# Patient Record
Sex: Male | Born: 1935 | Race: White | Hispanic: No | Marital: Married | Smoking: Former smoker
Health system: Southern US, Community
[De-identification: ages and names within clinical notes are randomized; demographics above are authoritative.]

## PROBLEM LIST (undated history)

## (undated) DIAGNOSIS — E7800 Pure hypercholesterolemia, unspecified: Secondary | ICD-10-CM

## (undated) DIAGNOSIS — I442 Atrioventricular block, complete: Secondary | ICD-10-CM

## (undated) DIAGNOSIS — K5792 Diverticulitis of intestine, part unspecified, without perforation or abscess without bleeding: Secondary | ICD-10-CM

## (undated) DIAGNOSIS — C493 Malignant neoplasm of connective and soft tissue of thorax: Secondary | ICD-10-CM

## (undated) DIAGNOSIS — I10 Essential (primary) hypertension: Secondary | ICD-10-CM

## (undated) HISTORY — DX: Essential (primary) hypertension: I10

## (undated) HISTORY — DX: Diverticulitis of intestine, part unspecified, without perforation or abscess without bleeding: K57.92

## (undated) HISTORY — DX: Atrioventricular block, complete: I44.2

## (undated) HISTORY — DX: Malignant neoplasm of connective and soft tissue of thorax: C49.3

## (undated) HISTORY — DX: Pure hypercholesterolemia, unspecified: E78.00

## (undated) HISTORY — PX: TUMOR EXCISION: SHX421

---

## 2004-08-26 ENCOUNTER — Encounter: Admission: RE | Admit: 2004-08-26 | Discharge: 2004-08-26 | Payer: Self-pay | Admitting: Family Medicine

## 2008-07-10 ENCOUNTER — Encounter: Admission: RE | Admit: 2008-07-10 | Discharge: 2008-07-10 | Payer: Self-pay | Admitting: Family Medicine

## 2015-09-23 DIAGNOSIS — I442 Atrioventricular block, complete: Secondary | ICD-10-CM

## 2015-09-23 HISTORY — DX: Atrioventricular block, complete: I44.2

## 2015-09-27 ENCOUNTER — Telehealth: Payer: Self-pay | Admitting: Nurse Practitioner

## 2015-09-27 NOTE — Telephone Encounter (Signed)
[facility] records received placed in chart prep bin for 10/01/15 appointment with L. Servando Snare.

## 2015-09-28 ENCOUNTER — Encounter: Payer: Self-pay | Admitting: Nurse Practitioner

## 2015-10-01 ENCOUNTER — Ambulatory Visit: Payer: Self-pay | Admitting: Nurse Practitioner

## 2015-10-08 ENCOUNTER — Encounter: Payer: Self-pay | Admitting: *Deleted

## 2015-10-08 ENCOUNTER — Telehealth: Payer: Self-pay | Admitting: Internal Medicine

## 2015-10-08 ENCOUNTER — Ambulatory Visit (INDEPENDENT_AMBULATORY_CARE_PROVIDER_SITE_OTHER): Payer: Medicare Other | Admitting: Nurse Practitioner

## 2015-10-08 ENCOUNTER — Encounter: Payer: Self-pay | Admitting: Nurse Practitioner

## 2015-10-08 VITALS — BP 140/82 | HR 52 | Ht 69.0 in | Wt 199.0 lb

## 2015-10-08 DIAGNOSIS — I442 Atrioventricular block, complete: Secondary | ICD-10-CM

## 2015-10-08 NOTE — Telephone Encounter (Signed)
I left a message for the patient to call. Need to try to move his procedure time with Dr. Caryl Comes from 11:30 am start to 9:30 am start time.

## 2015-10-08 NOTE — Patient Instructions (Addendum)
We will be checking the following labs today - NONE  Come for Lab on January 27th - BMET, CBC, PT, PTT   Medication Instructions:    Continue with your current medicines.     Testing/Procedures To Be Arranged:  Echocardiogram on Wednesday at 7:30 AM  Follow-Up:   Pacemaker scheduled for Friday, February 3rd    Other Special Instructions:   N/A    If you need a refill on your cardiac medications before your next appointment, please call your pharmacy.   Call the [place] office at [phone] if you have any questions, problems or concerns.
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
## 2015-10-09 NOTE — Telephone Encounter (Signed)
I spoke with the patient's wife. She is aware to have the patient arrive at 7:30 am on 10/26/15 for a 9:30 am case.

## 2015-10-09 NOTE — Telephone Encounter (Signed)
F/u ° ° °Pt returning your call °

## 2015-10-10 ENCOUNTER — Other Ambulatory Visit: Payer: Self-pay

## 2015-10-10 ENCOUNTER — Ambulatory Visit (HOSPITAL_COMMUNITY): Payer: Medicare Other | Attending: Cardiovascular Disease

## 2015-10-10 DIAGNOSIS — I517 Cardiomegaly: Secondary | ICD-10-CM | POA: Diagnosis not present

## 2015-10-10 DIAGNOSIS — I350 Nonrheumatic aortic (valve) stenosis: Secondary | ICD-10-CM | POA: Diagnosis not present

## 2015-10-10 DIAGNOSIS — I071 Rheumatic tricuspid insufficiency: Secondary | ICD-10-CM | POA: Insufficient documentation

## 2015-10-10 DIAGNOSIS — I442 Atrioventricular block, complete: Secondary | ICD-10-CM | POA: Insufficient documentation

## 2015-10-10 DIAGNOSIS — R011 Cardiac murmur, unspecified: Secondary | ICD-10-CM | POA: Diagnosis present

## 2015-10-10 DIAGNOSIS — R001 Bradycardia, unspecified: Secondary | ICD-10-CM | POA: Insufficient documentation

## 2015-10-12 ENCOUNTER — Other Ambulatory Visit: Payer: Self-pay | Admitting: Internal Medicine

## 2015-10-12 DIAGNOSIS — R001 Bradycardia, unspecified: Secondary | ICD-10-CM

## 2015-10-15 ENCOUNTER — Ambulatory Visit: Payer: Medicare Other | Admitting: Physician Assistant

## 2015-10-19 ENCOUNTER — Other Ambulatory Visit (INDEPENDENT_AMBULATORY_CARE_PROVIDER_SITE_OTHER): Payer: Medicare Other | Admitting: *Deleted

## 2015-10-19 DIAGNOSIS — I442 Atrioventricular block, complete: Secondary | ICD-10-CM | POA: Diagnosis not present

## 2015-10-19 LAB — BASIC METABOLIC PANEL
BUN: 19 mg/dL (ref 7–25)
CO2: 26 mmol/L (ref 20–31)
Calcium: 9.1 mg/dL (ref 8.6–10.3)
Chloride: 107 mmol/L (ref 98–110)
Creat: 1.27 mg/dL — ABNORMAL HIGH (ref 0.70–1.18)
Glucose, Bld: 91 mg/dL (ref 65–99)
Potassium: 3.9 mmol/L (ref 3.5–5.3)
Sodium: 142 mmol/L (ref 135–146)

## 2015-10-19 LAB — CBC
HCT: 49.6 % (ref 39.0–52.0)
Hemoglobin: 16.3 g/dL (ref 13.0–17.0)
MCH: 31.1 pg (ref 26.0–34.0)
MCHC: 32.9 g/dL (ref 30.0–36.0)
MCV: 94.7 fL (ref 78.0–100.0)
MPV: 10.7 fL (ref 8.6–12.4)
Platelets: 180 10*3/uL (ref 150–400)
RBC: 5.24 MIL/uL (ref 4.22–5.81)
RDW: 14 % (ref 11.5–15.5)
WBC: 5.6 10*3/uL (ref 4.0–10.5)

## 2015-10-19 NOTE — Addendum Note (Signed)
Addended by: Eulis Foster on: 10/19/2015 07:52 AM   Modules accepted: Orders

## 2015-10-20 LAB — APTT: aPTT: 33 seconds (ref 24–37)

## 2015-10-20 LAB — PROTIME-INR
INR: 1.02 (ref <1.50)
Prothrombin Time: 13.5 seconds (ref 11.6–15.2)

## 2015-10-26 ENCOUNTER — Encounter (HOSPITAL_COMMUNITY): Payer: Self-pay | Admitting: General Practice

## 2015-10-26 ENCOUNTER — Encounter (HOSPITAL_COMMUNITY)
Admission: RE | Disposition: A | Discharge status: Home / Self Care | Payer: Medicare Other | Source: Ambulatory Visit | Attending: Internal Medicine

## 2015-10-26 ENCOUNTER — Ambulatory Visit (HOSPITAL_COMMUNITY)
Admission: RE | Admit: 2015-10-26 | Discharge: 2015-10-27 | Disposition: A | Discharge status: Home / Self Care | Payer: Medicare Other | Source: Ambulatory Visit | Attending: Internal Medicine | Admitting: Internal Medicine

## 2015-10-26 DIAGNOSIS — Z87891 Personal history of nicotine dependence: Secondary | ICD-10-CM | POA: Diagnosis not present

## 2015-10-26 DIAGNOSIS — I10 Essential (primary) hypertension: Secondary | ICD-10-CM | POA: Diagnosis not present

## 2015-10-26 DIAGNOSIS — Z8249 Family history of ischemic heart disease and other diseases of the circulatory system: Secondary | ICD-10-CM | POA: Diagnosis not present

## 2015-10-26 DIAGNOSIS — F411 Generalized anxiety disorder: Secondary | ICD-10-CM | POA: Diagnosis not present

## 2015-10-26 DIAGNOSIS — M1990 Unspecified osteoarthritis, unspecified site: Secondary | ICD-10-CM | POA: Insufficient documentation

## 2015-10-26 DIAGNOSIS — K5790 Diverticulosis of intestine, part unspecified, without perforation or abscess without bleeding: Secondary | ICD-10-CM | POA: Diagnosis not present

## 2015-10-26 DIAGNOSIS — J449 Chronic obstructive pulmonary disease, unspecified: Secondary | ICD-10-CM | POA: Diagnosis not present

## 2015-10-26 DIAGNOSIS — I442 Atrioventricular block, complete: Secondary | ICD-10-CM | POA: Diagnosis present

## 2015-10-26 DIAGNOSIS — Z959 Presence of cardiac and vascular implant and graft, unspecified: Secondary | ICD-10-CM

## 2015-10-26 DIAGNOSIS — Z7982 Long term (current) use of aspirin: Secondary | ICD-10-CM | POA: Insufficient documentation

## 2015-10-26 DIAGNOSIS — E7800 Pure hypercholesterolemia, unspecified: Secondary | ICD-10-CM | POA: Insufficient documentation

## 2015-10-26 DIAGNOSIS — I350 Nonrheumatic aortic (valve) stenosis: Secondary | ICD-10-CM | POA: Insufficient documentation

## 2015-10-26 DIAGNOSIS — E785 Hyperlipidemia, unspecified: Secondary | ICD-10-CM | POA: Diagnosis not present

## 2015-10-26 DIAGNOSIS — Z85828 Personal history of other malignant neoplasm of skin: Secondary | ICD-10-CM | POA: Diagnosis not present

## 2015-10-26 DIAGNOSIS — R001 Bradycardia, unspecified: Secondary | ICD-10-CM

## 2015-10-26 HISTORY — PX: EP IMPLANTABLE DEVICE: SHX172B

## 2015-10-26 LAB — SURGICAL PCR SCREEN
MRSA, PCR: NEGATIVE
Staphylococcus aureus: NEGATIVE

## 2015-10-26 SURGERY — PACEMAKER IMPLANT

## 2015-10-26 MED ORDER — MUPIROCIN 2 % EX OINT
1.0000 "application " | TOPICAL_OINTMENT | Freq: Once | CUTANEOUS | Status: AC
  Administered 2015-10-26: 1 via TOPICAL

## 2015-10-26 MED ORDER — CEFAZOLIN SODIUM-DEXTROSE 2-3 GM-% IV SOLR
2.0000 g | INTRAVENOUS | Status: AC
  Administered 2015-10-26: 2 g via INTRAVENOUS

## 2015-10-26 MED ORDER — MIDAZOLAM HCL 5 MG/5ML IJ SOLN
INTRAMUSCULAR | Status: AC
  Filled 2015-10-26: qty 5

## 2015-10-26 MED ORDER — MUPIROCIN 2 % EX OINT
TOPICAL_OINTMENT | CUTANEOUS | Status: AC
  Administered 2015-10-26: 1 via TOPICAL
  Filled 2015-10-26: qty 22

## 2015-10-26 MED ORDER — CEFAZOLIN SODIUM-DEXTROSE 2-3 GM-% IV SOLR
INTRAVENOUS | Status: AC
  Filled 2015-10-26: qty 50

## 2015-10-26 MED ORDER — ALBUTEROL SULFATE (2.5 MG/3ML) 0.083% IN NEBU: 2.5000 mg | INHALATION_SOLUTION | RESPIRATORY_TRACT | Status: DC | PRN

## 2015-10-26 MED ORDER — SODIUM CHLORIDE 0.9 % IR SOLN
Status: AC
Start: 2015-10-26 — End: 2015-10-26
  Filled 2015-10-26: qty 2

## 2015-10-26 MED ORDER — MACA ROOT 500 MG PO CAPS: 500.0000 mg | ORAL_CAPSULE | Freq: Every day | ORAL | Status: DC

## 2015-10-26 MED ORDER — SODIUM CHLORIDE 0.9 % IV SOLN
INTRAVENOUS | Status: DC
  Administered 2015-10-26: 09:00:00 via INTRAVENOUS

## 2015-10-26 MED ORDER — ACETAMINOPHEN 325 MG PO TABS: 325.0000 mg | ORAL_TABLET | ORAL | Status: DC | PRN

## 2015-10-26 MED ORDER — HAWTHORNE BERRY 550 MG PO CAPS: 550.0000 mg | ORAL_CAPSULE | Freq: Every day | ORAL | Status: DC

## 2015-10-26 MED ORDER — LABETALOL HCL 200 MG PO TABS
200.0000 mg | ORAL_TABLET | Freq: Two times a day (BID) | ORAL | Status: DC
  Administered 2015-10-26 – 2015-10-27 (×3): 200 mg via ORAL
  Filled 2015-10-26 (×3): qty 1

## 2015-10-26 MED ORDER — FENTANYL CITRATE (PF) 100 MCG/2ML IJ SOLN
INTRAMUSCULAR | Status: AC
  Filled 2015-10-26: qty 2

## 2015-10-26 MED ORDER — LIDOCAINE HCL (PF) 1 % IJ SOLN
INTRAMUSCULAR | Status: AC
  Filled 2015-10-26: qty 30

## 2015-10-26 MED ORDER — MIDAZOLAM HCL 5 MG/5ML IJ SOLN
INTRAMUSCULAR | Status: DC | PRN
  Administered 2015-10-26 (×5): 1 mg via INTRAVENOUS

## 2015-10-26 MED ORDER — CHLORHEXIDINE GLUCONATE 4 % EX LIQD: 60.0000 mL | Freq: Once | CUTANEOUS | Status: DC

## 2015-10-26 MED ORDER — FENTANYL CITRATE (PF) 100 MCG/2ML IJ SOLN
INTRAMUSCULAR | Status: DC | PRN
  Administered 2015-10-26 (×2): 25 ug via INTRAVENOUS

## 2015-10-26 MED ORDER — CEFAZOLIN SODIUM 1-5 GM-% IV SOLN
1.0000 g | Freq: Four times a day (QID) | INTRAVENOUS | Status: AC
  Administered 2015-10-26 – 2015-10-27 (×3): 1 g via INTRAVENOUS
  Filled 2015-10-26 (×3): qty 50

## 2015-10-26 MED ORDER — SODIUM CHLORIDE 0.9 % IR SOLN
80.0000 mg | Status: AC
  Administered 2015-10-26: 80 mg

## 2015-10-26 MED ORDER — SODIUM CHLORIDE 0.9 % IV SOLN
INTRAVENOUS | Status: AC
  Administered 2015-10-26: 13:00:00 via INTRAVENOUS

## 2015-10-26 MED ORDER — HEPARIN (PORCINE) IN NACL 2-0.9 UNIT/ML-% IJ SOLN
INTRAMUSCULAR | Status: AC
  Filled 2015-10-26: qty 500

## 2015-10-26 MED ORDER — VITAMIN D 1000 UNITS PO TABS
2000.0000 [IU] | ORAL_TABLET | Freq: Every day | ORAL | Status: DC
  Administered 2015-10-26 – 2015-10-27 (×2): 2000 [IU] via ORAL
  Filled 2015-10-26 (×3): qty 2

## 2015-10-26 MED ORDER — DIAZEPAM 5 MG PO TABS
5.0000 mg | ORAL_TABLET | Freq: Four times a day (QID) | ORAL | Status: DC | PRN
  Administered 2015-10-26: 5 mg via ORAL
  Filled 2015-10-26: qty 1

## 2015-10-26 MED ORDER — COQ10 100 MG PO CAPS: 100.0000 mg | ORAL_CAPSULE | Freq: Every day | ORAL | Status: DC

## 2015-10-26 MED ORDER — SIMVASTATIN 20 MG PO TABS
20.0000 mg | ORAL_TABLET | Freq: Every day | ORAL | Status: DC
  Administered 2015-10-26 – 2015-10-27 (×2): 20 mg via ORAL
  Filled 2015-10-26 (×2): qty 1

## 2015-10-26 MED ORDER — LIDOCAINE HCL (PF) 1 % IJ SOLN
INTRAMUSCULAR | Status: DC | PRN
  Administered 2015-10-26: 50 mL

## 2015-10-26 MED ORDER — HEPARIN (PORCINE) IN NACL 2-0.9 UNIT/ML-% IJ SOLN
INTRAMUSCULAR | Status: DC | PRN
  Administered 2015-10-26: 11:00:00

## 2015-10-26 MED ORDER — ONDANSETRON HCL 4 MG/2ML IJ SOLN: 4.0000 mg | Freq: Four times a day (QID) | INTRAMUSCULAR | Status: DC | PRN

## 2015-10-26 SURGICAL SUPPLY — 8 items
CABLE SURGICAL S-101-97-12 (CABLE) ×2 IMPLANT
HEMOSTAT SURGICEL 2X4 FIBR (HEMOSTASIS) IMPLANT
LEAD CAPSURE NOVUS 5076-52CM (Lead) ×2 IMPLANT
LEAD CAPSURE NOVUS 5076-58CM (Lead) ×2 IMPLANT
PAD DEFIB LIFELINK (PAD) ×2 IMPLANT
PPM ADVISA MRI DR A2DR01 (Pacemaker) ×2 IMPLANT
SHEATH CLASSIC 7F (SHEATH) ×4 IMPLANT
TRAY PACEMAKER INSERTION (PACKS) ×2 IMPLANT

## 2015-10-26 NOTE — Interval H&P Note (Signed)
History and Physical Interval Note:  10/26/2015 9:08 AM  Sean Morrison  has presented today for surgery, with the diagnosis of hb  The various methods of treatment have been discussed with the patient and family. After consideration of risks, benefits and other options for treatment, the patient has consented to  Procedure(s): Pacemaker Implant (N/A) as a surgical intervention .  The patient's history has been reviewed, patient examined, no change in status, stable for surgery.  I have reviewed the patient's chart and labs.  Questions were answered to the patient's satisfaction.     Virl Axe  Echo normal EF>>dual chamber pacing

## 2015-10-26 NOTE — Progress Notes (Signed)
Left chest wall pacemaker insertion site dressing clean, dry, and intact. Sling applied to left arm. Report given to [facility], 2W 17. Patient alert, some mild confusion present. Vital signs stable.
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
## 2015-10-27 ENCOUNTER — Ambulatory Visit (HOSPITAL_COMMUNITY): Payer: Medicare Other

## 2015-10-27 DIAGNOSIS — I10 Essential (primary) hypertension: Secondary | ICD-10-CM | POA: Diagnosis not present

## 2015-10-27 DIAGNOSIS — E785 Hyperlipidemia, unspecified: Secondary | ICD-10-CM | POA: Diagnosis present

## 2015-10-27 DIAGNOSIS — I442 Atrioventricular block, complete: Secondary | ICD-10-CM

## 2015-10-27 DIAGNOSIS — K5790 Diverticulosis of intestine, part unspecified, without perforation or abscess without bleeding: Secondary | ICD-10-CM | POA: Diagnosis not present

## 2015-10-27 HISTORY — DX: Essential (primary) hypertension: I10

## 2015-10-27 MED ORDER — LABETALOL HCL 200 MG PO TABS: 200.0000 mg | ORAL_TABLET | Freq: Two times a day (BID) | ORAL | Status: DC

## 2015-10-27 NOTE — Progress Notes (Signed)
Return call from PA, stated ok to leave off cardiac monitoring. Someone will be around to d/c patient later this AM. Informed patient and his wife.
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
## 2015-10-27 NOTE — Progress Notes (Signed)
SUBJECTIVE: The patient is doing well today.  At this time, he denies chest pain, shortness of breath, or any new concerns.  Presented to the hospital for pacemaker placement after being found to have heart block.    . cholecalciferol  2,000 Units Oral Daily  . labetalol  200 mg Oral BID  . simvastatin  20 mg Oral Daily      OBJECTIVE: Physical Exam: Filed Vitals:   10/26/15 1205 10/26/15 1237 10/26/15 1800 10/26/15 2132  BP: 163/87 179/83 137/64 145/63  Pulse: 58 59  60  Temp:  97.5 F (36.4 C)  98.5 F (36.9 C)  TempSrc:  Oral  Oral  Resp: 8 16  16   Height:      Weight:      SpO2: 98% 100%  97%    Intake/Output Summary (Last 24 hours) at 10/27/15 [id number] Last data filed at 10/27/15 0000  Gross per 24 hour  Intake 1532.5 ml  Output    800 ml  Net  732.5 ml    Telemetry reveals sinus rhythm  GEN- The patient is well appearing, alert and oriented x 3 today.   Head- normocephalic, atraumatic Eyes-  Sclera clear, conjunctiva pink Ears- hearing intact Oropharynx- clear Neck- supple, no JVP Lymph- no cervical lymphadenopathy Lungs- Clear to ausculation bilaterally, normal work of breathing Heart- Regular rate and rhythm, no murmurs, rubs or gallops, PMI not laterally displaced GI- soft, NT, ND, + BS Extremities- no clubbing, cyanosis, or edema Skin- no rash or lesion Psych- euthymic mood, full affect Neuro- strength and sensation are intact  LABS: Basic Metabolic Panel: No results for input(s): NA, K, CL, CO2, GLUCOSE, BUN, CREATININE, CALCIUM, MG, PHOS in the last 72 hours. Liver Function Tests: No results for input(s): AST, ALT, ALKPHOS, BILITOT, PROT, ALBUMIN in the last 72 hours. No results for input(s): LIPASE, AMYLASE in the last 72 hours. CBC: No results for input(s): WBC, NEUTROABS, HGB, HCT, MCV, PLT in the last 72 hours. Cardiac Enzymes: No results for input(s): CKTOTAL, CKMB, CKMBINDEX, TROPONINI in the last 72 hours. BNP: Invalid input(s):  POCBNP D-Dimer: No results for input(s): DDIMER in the last 72 hours. Hemoglobin A1C: No results for input(s): HGBA1C in the last 72 hours. Fasting Lipid Panel: No results for input(s): CHOL, HDL, LDLCALC, TRIG, CHOLHDL, LDLDIRECT in the last 72 hours. Thyroid Function Tests: No results for input(s): TSH, T4TOTAL, T3FREE, THYROIDAB in the last 72 hours.  Invalid input(s): FREET3 Anemia Panel: No results for input(s): VITAMINB12, FOLATE, FERRITIN, TIBC, IRON, RETICCTPCT in the last 72 hours.  RADIOLOGY: Dg Chest 2 View  10/27/2015  CLINICAL DATA:  Status post pacemaker placement. EXAM: CHEST  2 VIEW COMPARISON:  07/10/2008 FINDINGS: A left subclavian approach dual lead pacemaker has been placed with leads terminating over the right atrium and right ventricle. The cardiac silhouette is mildly enlarged there is minimal right basilar opacity. Minimal left basilar opacity is similar to slightly less prominent than on the old study. No segmental airspace consolidation, edema, pleural effusion, or pneumothorax is identified. No acute osseous abnormality is seen. IMPRESSION: 1. Interval pacemaker placement.  No pneumothorax. 2. Minimal bibasilar atelectasis or scarring. Electronically Signed   By: Logan Bores M.D.   On: 10/27/2015 08:18    ASSESSMENT AND PLAN:  Active Problems:   Complete heart block [facility]) Patient with complete heart block now with dual chamber pacemaker.  Interrogation without abnormality, CXR with stable leads and no pneumothorax.  Plan for discharge today with follow up in  device clinic in 10 days.  Thien Berka Meredith Leeds, MD 10/27/2015 8:29 AM

## 2015-10-27 NOTE — Discharge Instructions (Signed)
Supplemental Discharge Instructions for  Pacemaker/Defibrillator Patients  Activity No heavy lifting or vigorous activity with your left/right arm for 6 to 8 weeks.  Do not raise your left/right arm above your head for one week.  Gradually raise your affected arm as drawn below.            2/10                        2/11                        2/12                         2/13 __  NO DRIVING for  One week   ; you may begin driving on  [id number]   .  WOUND CARE - Keep the wound area clean and dry.  Do not get this area wet for one week. No showers for one week; you may shower on  11/02/15   . - The tape/steri-strips on your wound will fall off; do not pull them off.  No bandage is needed on the site.  DO  NOT apply any creams, oils, or ointments to the wound area. - If you notice any drainage or discharge from the wound, any swelling or bruising at the site, or you develop a fever > 101? F after you are discharged home, call the office at once.  Special Instructions - You are still able to use cellular telephones; use the ear opposite the side where you have your pacemaker/defibrillator.  Avoid carrying your cellular phone near your device. - When traveling through airports, show security personnel your identification card to avoid being screened in the metal detectors.  Ask the security personnel to use the hand wand. - Avoid arc welding equipment, MRI testing (magnetic resonance imaging), TENS units (transcutaneous nerve stimulators).  Call the office for questions about other devices. - Avoid electrical appliances that are in poor condition or are not properly grounded. - Microwave ovens are safe to be near or to operate.  Additional information for defibrillator patients should your device go off: - If your device goes off ONCE and you feel fine afterward, notify the device clinic nurses. - If your device goes off ONCE and you do not feel well afterward, call 911. - If your device  goes off TWICE, call 911. - If your device goes off THREE times in one day, call 911.  DO NOT DRIVE YOURSELF OR A FAMILY MEMBER WITH A DEFIBRILLATOR TO THE HOSPITAL--CALL 911.    Biventricular Pacemaker Implantation A pacemaker is a small, lightweight, battery-powered device that is placed (implanted) under the skin in the upper chest. Your health care provider may prescribe a pacemaker for you if your heartbeat is too slow (bradycardia). A biventricular pacemaker is a pulse generator connected by wires called leads that go into the two lower chambers on the right and left sides of your heart (ventricles). It is used to treat symptoms of heart failure. The pulse generator is a small computer run by a battery. The generator creates a regular electronic pulse. The pulse is sent through the leads, which go through a blood vessel and into the ventricles of your heart. This type of pacemaker makes a weak heart more efficient. LET [facility] CARE PROVIDER KNOW ABOUT:  Any allergies. Some allergies can cause serious problems during the  procedure. Allergies to shellfish or agents, such as iodine, used in liquids that enhance specific areas of your body on X-ray images (contrast dyes) are especially problematic.   All medicines you take. These include vitamins, herbs, eye drops, over-the-counter medicines, and creams.   Use of steroids.   Problems with numbing medicines (anesthetics).  Bleeding problems.   Past surgeries.   Other health problems. RISKS AND COMPLICATIONS Implanting a biventricular pacemaker is usually a safe procedure but problems can occur. For example:   Too much bleeding may occur.   Infection may develop.   Blood vessels, your lungs, or your heart may be harmed.   The pacemaker may not make your condition better. BEFORE THE PROCEDURE   You may need to have blood tests, heart tests, or a chest X-ray done before the day of the procedure.   Ask your health  care provider about changing or stopping your regular medicines.   Make plans to have someone drive you home.  Stop smoking at least 24 hours before the procedure.  Take a bath or shower the night before the procedure. You may need to scrub your chest with a special type of soap.  Do not eat or drink anything after midnight the night before your procedure. Ask if it is okay to take any needed medicine with a small sip of water. PROCEDURE The procedure to put a pacemaker in your chest is usually done at a hospital in a room that has a large X-ray machine called a fluoroscope. The machine will be above you during the procedure. It will help your health care provider see your heart during the procedure. Before the procedure:   Small monitors will be put on your body. They will be used to check your heart, blood pressure, and oxygen level.  A needle will be put into a vein in your hand or arm. This is called an intravenous (IV) access tube. Fluids and medicine will flow directly into your body through the IV tube.  Your chest will be cleaned with a germ-killing (antiseptic) solution. Your chest may be shaved.  You may be given medicine to help you relax (sedative).   You will be given a numbing medicine called a local anesthetic. This medicine will make your chest area have no feeling while the pacemaker is implanted. You will be sleepy but awake during the procedure. After you are numb, the procedure will begin. The health care provider will:   Make a small cut (incision). This will make a pocket deep under your skin that will hold the pulse generator.  Guide the leads through a large blood vessel into your heart and attach them to the heart muscles.  Test the pacemaker.  Close the incision with stitches, glue, or staples. AFTER THE PROCEDURE  You may feel pain. Some pain is normal. It may last a few days.   You may stay in a recovery area until the local anesthetic has worn off.  Your blood pressure and pulse will be checked often. You will be taken to a room where your heartbeat will be monitored.  A chest X-ray will be taken. This checks that the pacemaker is in the right place.  The pacemaker will be checked before you go home. It can be adjusted if that is needed.   This information is not intended to replace advice given to you by your health care provider. Make sure you discuss any questions you have with your health care provider.  Document Released: 06/02/2012 Document Revised: 09/29/2014 Document Reviewed: 06/02/2012 Elsevier Interactive Patient Education 2016 [place] Defibrillator Implantation An implantable cardioverter defibrillator (ICD) is a small, lightweight, battery-powered device that is placed (implanted) under the skin in the chest or abdomen. Your caregiver may prescribe an ICD if:  You have had an irregular heart rhythm (arrhythmia) that originated in the lower chambers of the heart (ventricles).  Your heart has been damaged by a disease (such as coronary artery disease) or heart condition (such as a heart attack). An ICD consists of a battery that lasts several years, a small computer called a pulse generator, and wires called leads that go into the heart. It is used to detect and correct two dangerous arrhythmias: a rapid heart rhythm (tachycardia) and an arrhythmia in which the ventricles contract in an uncoordinated way (fibrillation). When an ICD detects tachycardia, it sends an electrical signal to the heart that restores the heartbeat to normal (cardioversion). This signal is usually painless. If cardioversion does not work or if the ICD detects fibrillation, it delivers a small electrical shock to the heart (defibrillation) to restart the heart. The shock may feel like a strong jolt in the chest.ICDs may be programmed to correct other problems. Sometimes, ICDs are programmed to act as another type of implantable device  called a pacemaker. Pacemakers are used to treat a slow heartbeat (bradycardia). LET YOUR CAREGIVER KNOW ABOUT:  Any allergies you have.  All medicines you are taking, including vitamins, herbs, eyedrops, and over-the-counter medicines and creams.  Previous problems you or members of your family have had with the use of anesthetics.  Any blood disorders you have had.  Other health problems you have. RISKS AND COMPLICATIONS Generally, the procedure to implant an ICD is safe. However, as with any surgical procedure, complications can occur. Possible complications associated with implanting an ICD include:  Swelling, bleeding, or bruising at the site where the ICD was implanted.  Infection at the site where the ICD was implanted.  A reaction to medicine used during the procedure.  Nerve, heart, or blood vessel damage.  Blood clots. BEFORE THE PROCEDURE  You may need to have blood tests, heart tests, or a chest X-ray done before the day of the procedure.  Ask your caregiver about changing or stopping your regular medicines.  Make plans to have someone drive you home. You may need to stay in the hospital overnight after the procedure.  Stop smoking at least 24 hours before the procedure.  Take a bath or shower the night before the procedure. You may need to scrub your chest or abdomen with a special type of soap.  Do not eat or drink before your procedure for as long as directed by your caregiver. Ask if it is okay to take any needed medicine with a small sip of water. PROCEDURE  The procedure to implant an ICD in your chest or abdomen is usually done at a hospital in a room that has a large X-ray machine called a fluoroscope. The machine will be above you during the procedure. It will help your caregiver see your heart during the procedure. Implanting an ICD usually takes 1-3 hours. Before the procedure:   Small monitors will be put on your body. They will be used to check your  heart, blood pressure, and oxygen level.  A needle will be put into a vein in your hand or arm. This is called an intravenous (IV) access tube. Fluids and medicine will flow  directly into your body through the IV tube.  Your chest or abdomen will be cleaned with a germ-killing (antiseptic) solution. The area may be shaved.  You may be given medicine to help you relax (sedative).  You will be given a medicine called a local anesthetic. This medicine will make the surgical site numb while the ICD is implanted. You will be sleepy but awake during the procedure. After you are numb the procedure will begin. The caregiver will:  Make a small cut (incision). This will make a pocket deep under your skin that will hold the pulse generator.  Guide the leads through a large blood vessel into your heart and attach them to the heart muscles. Depending on the ICD, the leads may go into one ventricle or they may go to both ventricles and into an upper chamber of the heart (atrium).  Test the ICD.  Close the incision with stitches, glue, or staples. AFTER THE PROCEDURE  You may feel pain. Some pain is normal. It may last a few days.  You may stay in a recovery area until the local anesthetic has worn off. Your blood pressure and pulse will be checked often. You will be taken to a room where your heart will be monitored.  A chest X-ray will be taken. This is done to check that the cardioverter defibrillator is in the right place.  You may stay in the hospital overnight.  A slight bump may be seen over the skin where the ICD was placed. Sometimes, it is possible to feel the ICD under the skin. This is normal.  In the months and years afterward, your caregiver will check the device, the leads, and the battery every few months. Eventually, when the battery is low, the ICD will be replaced.   This information is not intended to replace advice given to you by your health care provider. Make sure you  discuss any questions you have with your health care provider.   Document Released: 05/31/2002 Document Revised: 06/29/2013 Document Reviewed: 09/27/2012 Elsevier Interactive Patient Education 2016 [employer].  Electrophysiology Study An electrophysiology (EP) study is an invasive heart test done through catheters placed in a large vein in your groin, arm, neck, or chest. It is done to evaluate the electrical conduction system of your heart. An EP study is done if other heart tests have not found or fully explained the cause of symptoms such as:  Dizziness or fainting.  A fast heartbeat (tachycardia).  A slow heartbeat (bradycardia). If the cause of your symptoms is found during the EP study, your health care provider will discuss treatment options. Some treatment options that may be done to correct your symptoms include:  Cardiac ablation. Cardiac ablation destroys a small area of heart tissue that may be causing tachycardia.  Implantable cardioverter defibrillator (ICD). An ICD can detect a fast or abnormal heart rate. When an abnormal rhythm is detected, the ICD shocks the heart to restore it to a normal heart rhythm. LET [facility] CARE PROVIDER KNOW ABOUT:   Any allergies you have.  All medicines you are taking, including vitamins, herbs, eye drops, creams, and over-the-counter medicines.  Previous problems you or members of your family have had with the use of anesthetics.  Any blood disorders you have.  Previous surgeries you have had.  Medical conditions you have. RISKS AND COMPLICATIONS  Generally, this is a safe procedure. However, as with any procedure, problems can occur. Possible problems include:  Tachycardia that does not  go away. This may require shocking your heart (cardioversion).  Bleeding or bruising from the catheter insertion sites.  Infection at the catheter insertion sites.  Temporary or permanent heart rhythm abnormalities.  Temporary changes in  blood pressure.  Puncture (perforation) of the heart wall. This can cause bleeding between the heart and the sac that surrounds it (cardiac tamponade). This is a life-threatening condition that may require heart surgery.  Possible cardiac arrest or fatal heart arrhythmia. BEFORE THE PROCEDURE  Do not eat or drink before the EP study as instructed by your health care provider.  Be sure to urinate before the EP study.  If you are going home after the EP study, someone will need to drive you home and stay with you for 24 hours. PROCEDURE The EP study is performed in a catheterization laboratory. This is a room set up to do heart procedures.   You will be given medicine through an intravenous (IV) access to reduce discomfort and help you relax.  The area where the catheter will be inserted will be shaved as needed and cleansed. Sterile drapes will cover you. This will keep the area sterile.  Thin, flexible tubes (catheters) with an electrode tip will be inserted into a large vein. From there, the catheters are guided to the heart using a type of X-ray machine (fluoroscopy). Once in the heart, the catheters evaluate the electrical activity of your heart.  If you are awake during the EP study, you may feel dizzy or light-headed. Your heart rate may temporarily increase or you may feel your heart beating hard. Tell your health care provider if you feel dizzy, nauseated, or have chest pain or pressure during the EP study.  When the EP study is done, the catheters are removed.  Firm pressure is applied to the insertion sites. This is done to prevent bleeding. AFTER THE PROCEDURE  You will need to lie flat for a few hours or as told by your health care provider. You will need to keep your legs straight. Do not bend or cross your legs. This is done so the clot at the insertion does not break loose and cause bleeding.  If you took blood thinners before the EP study, ask your health care provider  when you can start taking them again.   This information is not intended to replace advice given to you by your health care provider. Make sure you discuss any questions you have with your health care provider.   Document Released: 02/26/2010 Document Revised: 09/13/2013 Document Reviewed: 03/23/2013 Elsevier Interactive Patient Education [employer].  Pacemaker Implantation The heart has its own electrical system, or natural pacemaker, to regulate the heartbeat. Sometimes, the natural pacemaker system of the heart fails and causes the heart to beat too slowly. If this happens, a pacemaker can be surgically placed to help the heart beat at a normal or programmed rate. A pacemaker is a small, battery-powered device that is placed under the skin and is programmed to sense your heartbeats. If your heart rate is lower than the programmed rate, the pacemaker will pace your heart. Parts of a pacemaker include:  Wires or leads. The leads are placed in the heart and transmit electricity to the heart. The leads are connected to the pulse generator.  Pulse generator. The pulse generator contains a computer and a memory system. The pulse generator also produces the electrical signal that triggers the heart to beat. A pacemaker may be placed if:  You have a  slow heartbeat (bradycardia).  You have fainting (syncope).  Shortness of breath (dyspnea) due to heart problems. LET [facility] CARE PROVIDER KNOW ABOUT:  Any allergies you may have.  All medicines you are taking, including vitamins, herbs, eye drops, creams, and over-the-counter medicines.  Previous problems you or members of your family have had with the use of anesthetics.  Any blood disorders you have.  Previous surgeries you have had.  Medical conditions you have.  Possibility of pregnancy, if this applies. RISKS AND COMPLICATIONS Generally, pacemaker implantation is a safe procedure. However, problems can occur and  include:  Bleeding.  Unable to place the pacemaker under local sedation.  Infection. BEFORE THE PROCEDURE  You will have blood work drawn before the procedure.  Do not use any tobacco products including cigarettes, chewing tobacco, or electronic cigarettes. If you need help quitting, ask your health care provider.  Do not eat or drink anything after midnight on the night before the procedure or as directed by your health care provider.  Ask your health care provider about:  Changing or stopping your regular medicines. This is especially important if you are taking diabetes medicines or blood thinners.  Taking medicines such as aspirin and ibuprofen. These medicines can thin your blood. Do not take these medicines before your procedure if your health care provider asks you not to.  Ask your health care provider if you can take a sip of water with any approved medicines the morning of the procedure. PROCEDURE  The surgery to place a pacemaker is considered a minimally invasive surgical procedure. It is done under a local anesthetic, which is an injection at the incision site that makes the skin numb. You are also given sedation and pain medicine that makes you drowsy during the procedure.   An intravenous line (IV) will be started in your hand or arm so sedation and pain medicine can be given during the pacemaker procedure.  A numbing medicine will be injected into the skin where the pacemaker is to be placed. A small incision will then be made into the skin. The pacemaker is usually placed under the skin near the collarbone.  After the incision has been made, the leads will be inserted into a large vein and guided into the heart using X-ray.  Using the same incision that was used to place the leads, a small pocket will be created under the skin to hold the pulse generator. The leads will then be connected to the pulse generator.  The incision site will then be closed. A bandage  (dressing) is placed over the pacemaker site. The dressing is removed 24-48 hours afterward. AFTER THE PROCEDURE  You will be taken to a recovery area after the pacemaker implant. Your vital signs such as blood pressure, heart rate, breathing, and oxygen levels will be monitored.  A chest X-ray will be done after the pacemaker has been implanted. This is to make sure the pacemaker and leads are in the correct place.   This information is not intended to replace advice given to you by your health care provider. Make sure you discuss any questions you have with your health care provider.   Document Released: 08/29/2002 Document Revised: 09/29/2014 Document Reviewed: 01/13/2012 Elsevier Interactive Patient Education [employer].

## 2015-10-27 NOTE — Progress Notes (Signed)
Call from telemetry - all wires off. Patient has taken off leads, wife states, "He's been d/c'ed. We are going home." Explained orders have not been entered. Tx paged Bhagat, PA to inquire/ request orders to d/c patient.

## 2015-10-27 NOTE — Progress Notes (Signed)
D/C teaching completed with patient and his wife including reviewed AVS printout, Rx, restrictions to LUE d/t pacemaker insertion site wound, wound care and attached information regarding pace maker, etc. Questions answered. Wife has personal possessions and has gone to retrieve the car - to meet [employer] exit. Patient has Box issued with pacemaker equipment inside. Pushed via w/c by this RN to [employer] exit to meet wife. Assisted to enter car drive home.

## 2015-10-27 NOTE — Progress Notes (Signed)
Tx paged Tarri Fuller, PA; returned call. Requested send Rx to [facility] [address]; this RN has updated pharmacy selection. Stated he would follow-up.

## 2015-10-29 ENCOUNTER — Telehealth: Payer: Self-pay | Admitting: Internal Medicine

## 2015-10-29 ENCOUNTER — Encounter (HOSPITAL_COMMUNITY): Payer: Self-pay | Admitting: Internal Medicine

## 2015-10-29 MED FILL — Sodium Chloride Irrigation Soln 0.9%: Qty: 1000 | Status: AC

## 2015-10-29 MED FILL — Gentamicin Sulfate Inj 40 MG/ML: INTRAMUSCULAR | Qty: 2 | Status: AC

## 2015-10-29 NOTE — Telephone Encounter (Signed)
No answer, no VM

## 2015-10-29 NOTE — Telephone Encounter (Signed)
Called pt's wife back, she had a question regarding how soon pt can begin lifting affected arm.  Instructed her that it is not recommended to lift hand of affected arm to the level of the ear for a week after procedure.    Also had a question regarding how to set up [place]aley.  Told her I would forward this question to our device team.

## 2015-10-29 NOTE — Telephone Encounter (Signed)
New message:  Pt's wife called in wanting to speak with Dr. Caryl Comes about the pace maker that the pt had placed on 2/3.

## 2015-11-05 ENCOUNTER — Ambulatory Visit (INDEPENDENT_AMBULATORY_CARE_PROVIDER_SITE_OTHER): Payer: Medicare Other | Admitting: *Deleted

## 2015-11-05 ENCOUNTER — Encounter: Payer: Self-pay | Admitting: Internal Medicine

## 2015-11-05 DIAGNOSIS — Z959 Presence of cardiac and vascular implant and graft, unspecified: Secondary | ICD-10-CM | POA: Diagnosis not present

## 2015-11-05 DIAGNOSIS — I442 Atrioventricular block, complete: Secondary | ICD-10-CM | POA: Diagnosis not present

## 2015-11-05 LAB — CUP PACEART INCLINIC DEVICE CHECK
Battery Voltage: 3.14 V
Brady Statistic AP VP Percent: 94.25 %
Brady Statistic AP VS Percent: 0 %
Brady Statistic AS VS Percent: 0 %
Date Time Interrogation Session: 20170213111958
Implantable Lead Implant Date: 20170203
Implantable Lead Location: 753859
Implantable Lead Model: 5076
Lead Channel Impedance Value: 475 Ohm
Lead Channel Impedance Value: 551 Ohm
Lead Channel Pacing Threshold Amplitude: 0.5 V
Lead Channel Pacing Threshold Pulse Width: 0.4 ms
Lead Channel Setting Pacing Amplitude: 3.5 V
Lead Channel Setting Pacing Amplitude: 3.5 V
Lead Channel Setting Pacing Pulse Width: 0.4 ms
Lead Channel Setting Sensing Sensitivity: 0.9 mV
MDC IDC LEAD IMPLANT DT: 20170203
MDC IDC LEAD LOCATION: 753860
MDC IDC MSMT LEADCHNL RA IMPEDANCE VALUE: 399 Ohm
MDC IDC MSMT LEADCHNL RA PACING THRESHOLD AMPLITUDE: 0.5 V
MDC IDC MSMT LEADCHNL RA PACING THRESHOLD PULSEWIDTH: 0.4 ms
MDC IDC MSMT LEADCHNL RA SENSING INTR AMPL: 1.875 mV
MDC IDC MSMT LEADCHNL RV IMPEDANCE VALUE: 570 Ohm
MDC IDC STAT BRADY AS VP PERCENT: 5.75 %
MDC IDC STAT BRADY RA PERCENT PACED: 94.25 %
MDC IDC STAT BRADY RV PERCENT PACED: 100 %

## 2015-11-05 NOTE — Progress Notes (Signed)
Wound check appointment. Dermabond removed. Wound without redness or edema. Incision edges unapproximated left lateral side of incision. Chanetta Marshall, NP evaluated incision- recommends to close incision with steri-strips and reevaluate in 1 week. 3 Steri-strips applied. Normal device function. Thresholds, sensing, and impedances consistent with implant measurements. Device programmed at 3.5V for extra safety margin until 3 month visit. Histogram distribution flat for patient's  level of activity-- RR turned on at nominal settings. No mode switches or high ventricular rates noted. Patient educated about wound care, arm mobility, lifting restrictions. ROV  in device clinic 11/12/15 and with SK 01/29/16.

## 2015-11-12 ENCOUNTER — Ambulatory Visit: Payer: Medicare Other

## 2015-11-13 ENCOUNTER — Ambulatory Visit (INDEPENDENT_AMBULATORY_CARE_PROVIDER_SITE_OTHER): Payer: Medicare Other | Admitting: *Deleted

## 2015-11-13 DIAGNOSIS — Z959 Presence of cardiac and vascular implant and graft, unspecified: Secondary | ICD-10-CM

## 2015-11-13 NOTE — Progress Notes (Signed)
Mr. Mandujano here today for wound re-evaluation. Steri-strips removed. Incision approximated. No redness, swelling or drainage. Patient and wife educated about wound care and activity restrictions. ROV with SK 01/29/16.

## 2016-01-29 ENCOUNTER — Ambulatory Visit (INDEPENDENT_AMBULATORY_CARE_PROVIDER_SITE_OTHER): Payer: Medicare Other | Admitting: Internal Medicine

## 2016-01-29 ENCOUNTER — Encounter: Payer: Self-pay | Admitting: Internal Medicine

## 2016-01-29 VITALS — BP 157/88 | HR 66 | Ht 69.0 in | Wt 192.0 lb

## 2016-01-29 DIAGNOSIS — R079 Chest pain, unspecified: Secondary | ICD-10-CM | POA: Diagnosis not present

## 2016-01-29 DIAGNOSIS — Z950 Presence of cardiac pacemaker: Secondary | ICD-10-CM | POA: Diagnosis not present

## 2016-01-29 DIAGNOSIS — I442 Atrioventricular block, complete: Secondary | ICD-10-CM

## 2016-01-29 NOTE — Patient Instructions (Signed)
Your physician recommends that you continue on your current medications as directed. Please refer to the Current Medication list given to you today.  Your physician has requested that you have a lexiscan myoview. For further information please visit [email or web address]. Please follow instruction sheet, as given.   Remote monitoring is used to monitor your Pacemaker of ICD from home. This monitoring reduces the number of office visits required to check your device to one time per year. It allows [place] to keep an eye on the functioning of your device to ensure it is working properly. You are scheduled for a device check from home on 04/29/16. You may send your transmission at any time that day. If you have a wireless device, the transmission will be sent automatically. After your physician reviews your transmission, you will receive a postcard with your next transmission date.  Your physician wants you to follow-up in: 9 mo with Sean Marshall, NP.   You will receive a reminder letter in the mail two months in advance. If you don't receive a letter, please call our office to schedule the follow-up appointment.

## 2016-01-29 NOTE — Progress Notes (Signed)
Patient Care Team: L.Donnie Coffin, MD as PCP - General (Family Medicine)   HPI  Sean Morrison is a 81 y.o. male Seen in follow-up for pacemaker implanted 2/17 for complete heart block.(Medtronic MRI)  Echocardiogram 1/17 to demonstrated normal LV function was severely dilated left atrium mild-moderate MR and pulmonary hypertension  Improved energy. No lightheadedness.  No significant lightheadedness but he is having episodicand mostly exertional chest discomfort that is midsternal and radiating bilaterally. It is relieved with rest.  Records and Results Reviewed  Past Medical History  Diagnosis Date  . 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 Diverticulitis   . Elevated cholesterol   . History of complete heart block 09/2015  . Presence of permanent cardiac pacemaker 10/26/2015  . Essential hypertension 10/27/2015    Past Surgical History  Procedure Laterality Date  . Insert / replace / remove pacemaker  10/26/2015  . Tumor excision    . Ep implantable device N/A 10/26/2015    Procedure: Pacemaker Implant;  Surgeon: Deboraha Sprang, MD;  Location: [facility];  Service: Cardiovascular;  Laterality: N/A;    Current Outpatient Prescriptions  Medication Sig Dispense Refill  . albuterol (PROVENTIL HFA) 108 (90 Base) MCG/ACT inhaler Inhale 2 puffs into the lungs every 4 (four) hours as needed for wheezing or shortness of breath.    . butalbital-aspirin-caffeine-codeine (FIORINAL/CODEINE #3) 50-325-40-30 MG capsule Take 1 capsule by mouth every 4 (four) hours as needed for pain.    . Cholecalciferol (VITAMIN D3) 2000 units TABS Take 2,000 Units by mouth daily.     . Coenzyme Q10 (COQ10) 100 MG CAPS Take 100 mg by mouth daily.     . diazepam (VALIUM) 5 MG tablet Take 5 mg by mouth every 6 (six) hours as needed for anxiety.    . fenofibrate micronized (LOFIBRA) 134 MG capsule Take 134 mg by mouth every evening.     . fluocinonide cream (LIDEX) AB-123456789 % Apply 1 application  topically as needed.    Cathrine Muster 550 MG CAPS Take 550 mg by mouth daily.     Marland Kitchen labetalol (NORMODYNE) 200 MG tablet Take 1 tablet (200 mg total) by mouth 2 (two) times daily. 60 tablet 11  . Maca Root 500 MG CAPS Take 500 mg by mouth daily.     . meloxicam (MOBIC) 15 MG tablet Take 15 mg by mouth daily as needed for pain.     Marland Kitchen nystatin-triamcinolone (MYCOLOG II) cream Apply 1 application topically 2 (two) times daily as needed (rash).   1  . Omega-3 Fatty Acids (FISH OIL) 1000 MG CAPS Take 1,000 mg by mouth daily.     . sildenafil (REVATIO) 20 MG tablet TAKE 2-5 TABLETS BY MOUTH AS NEEDED    . simvastatin (ZOCOR) 20 MG tablet Take 20 mg by mouth daily.     No current facility-administered medications for this visit.    No Known Allergies    Review of Systems negative except from HPI and PMH  Physical Exam BP 157/88 mmHg  Pulse 66  Ht 5\' 9"  (1.753 m)  Wt 192 lb (87.091 kg)  BMI 28.34 kg/m2 Well developed and well nourished in no acute distress HENT normal E scleral and icterus clear Neck Supple JVP flat; carotids brisk and full Clear to ausculation Device pocket well healed; without hematoma or erythema.  There is no tethering  Regular rate and rhythm, no murmurs gallops or rub Soft with active bowel sounds No  clubbing cyanosis tr Edema Alert and oriented, grossly normal motor and sensory function Skin Warm and Dry  ECG AV pacing  Assessment and  Plan  Complete heart block  Hypertension  Chest pain-exertional  Pacemaker-Medtronic  Device function is normal. He is device dependent.  Exertional chest discomfort is concerning. It could be that in the context of improved heart rate that he is now increased oxygen demand. The pain does not sound related to mechanical issues associated with his device i.e. Perforation we will undertake a Myoview scan  His blood pressure is elevated today. They have a machine at home. They will record blood pressures over the  next week prior to their visit with his PCP next Tuesday.  If augmentation as needed I would increase his labetalol.

## 2016-01-31 ENCOUNTER — Telehealth (HOSPITAL_COMMUNITY): Payer: Self-pay | Admitting: *Deleted

## 2016-01-31 NOTE — Telephone Encounter (Signed)
Patient HOH. Patient gave permission to tell wife.Patient's wife given detailed instructions per Myocardial Perfusion Study Information Sheet for the test on 02/06/16 at 0715. Patient notified to arrive 15 minutes early and that it is imperative to arrive on time for appointment to keep from having the test rescheduled.  If you need to cancel or reschedule your appointment, please call the office within 24 hours of your appointment. Failure to do so may result in a cancellation of your appointment, and a $50 no show fee. Patient verbalized understanding.Cambridge Deleo, Ranae Palms

## 2016-02-05 LAB — CUP PACEART INCLINIC DEVICE CHECK
Brady Statistic AP VP Percent: 97.82 %
Brady Statistic AP VS Percent: 0 %
Brady Statistic AS VP Percent: 2.18 %
Brady Statistic AS VS Percent: 0 %
Brady Statistic RV Percent Paced: 100 %
Implantable Lead Implant Date: 20170203
Implantable Lead Location: 753860
Lead Channel Impedance Value: 475 Ohm
Lead Channel Impedance Value: 513 Ohm
Lead Channel Pacing Threshold Amplitude: 0.5 V
Lead Channel Pacing Threshold Amplitude: 0.5 V
Lead Channel Pacing Threshold Pulse Width: 0.2 ms
Lead Channel Pacing Threshold Pulse Width: 0.4 ms
Lead Channel Setting Pacing Amplitude: 3.5 V
Lead Channel Setting Sensing Sensitivity: 0.9 mV
MDC IDC LEAD IMPLANT DT: 20170203
MDC IDC LEAD LOCATION: 753859
MDC IDC MSMT BATTERY REMAINING LONGEVITY: 107 mo
MDC IDC MSMT BATTERY VOLTAGE: 3.04 V
MDC IDC MSMT LEADCHNL RA IMPEDANCE VALUE: 361 Ohm
MDC IDC MSMT LEADCHNL RA SENSING INTR AMPL: 1.125 mV
MDC IDC MSMT LEADCHNL RV IMPEDANCE VALUE: 608 Ohm
MDC IDC SESS DTM: 20170509180637
MDC IDC SET LEADCHNL RV PACING AMPLITUDE: 2 V
MDC IDC SET LEADCHNL RV PACING PULSEWIDTH: 0.4 ms
MDC IDC STAT BRADY RA PERCENT PACED: 97.82 %

## 2016-02-06 ENCOUNTER — Ambulatory Visit (HOSPITAL_COMMUNITY): Payer: Medicare Other | Attending: Cardiology

## 2016-02-06 DIAGNOSIS — R079 Chest pain, unspecified: Secondary | ICD-10-CM | POA: Insufficient documentation

## 2016-02-06 DIAGNOSIS — Z950 Presence of cardiac pacemaker: Secondary | ICD-10-CM | POA: Diagnosis not present

## 2016-02-06 DIAGNOSIS — I10 Essential (primary) hypertension: Secondary | ICD-10-CM | POA: Insufficient documentation

## 2016-02-06 DIAGNOSIS — I442 Atrioventricular block, complete: Secondary | ICD-10-CM | POA: Insufficient documentation

## 2016-02-06 LAB — MYOCARDIAL PERFUSION IMAGING
CHL CUP NUCLEAR SDS: 4
CHL CUP NUCLEAR SRS: 7
CHL CUP NUCLEAR SSS: 11
CHL CUP RESTING HR STRESS: 63 {beats}/min
LV dias vol: 107 mL (ref 62–150)
LV sys vol: 50 mL
Peak HR: 64 {beats}/min
RATE: 0.27
TID: 0.93

## 2016-02-06 MED ORDER — REGADENOSON 0.4 MG/5ML IV SOLN
0.4000 mg | Freq: Once | INTRAVENOUS | Status: AC
  Administered 2016-02-06: 0.4 mg via INTRAVENOUS

## 2016-02-06 MED ORDER — TECHNETIUM TC 99M TETROFOSMIN IV KIT
10.6000 | PACK | Freq: Once | INTRAVENOUS | Status: AC | PRN
  Administered 2016-02-06: 11 via INTRAVENOUS
  Filled 2016-02-06: qty 11

## 2016-02-06 MED ORDER — TECHNETIUM TC 99M TETROFOSMIN IV KIT
30.1000 | PACK | Freq: Once | INTRAVENOUS | Status: AC | PRN
  Administered 2016-02-06: 30.1 via INTRAVENOUS
  Filled 2016-02-06: qty 30

## 2016-04-08 ENCOUNTER — Telehealth: Payer: Self-pay | Admitting: Internal Medicine

## 2016-04-08 NOTE — Telephone Encounter (Signed)
New message      Wife thinks pt is having is having an allergic reaction to bp medication.  He has an itchy rash.  He saw his PCP.  Please call

## 2016-04-08 NOTE — Telephone Encounter (Signed)
Attempted to call the patient's wife.  No answer/ no voice mail. Will call back at a later time.

## 2016-04-09 NOTE — Telephone Encounter (Signed)
I left a message for the patient's wife to call back.  

## 2016-04-09 NOTE — Telephone Encounter (Signed)
I called and spoke with the patient's wife.  She states that the patient has complained of his skin itching over the last few months. He was seen by his PCP on 7/11 and given a steroid injection for the itching + a rash that he has from his neck to his belt line. He had a PPM implant 10/26/15. He was started on labetolol 200 mg twice daily during that admission. Per the patient's wife, she states the patient relates the itching back that far.  I advised her I will review with Dr. Caryl Comes and call her back.  She is agreeable. She request I call her home # first [phone].

## 2016-04-09 NOTE — Telephone Encounter (Signed)
The rash is almost certainly not from the pacemaker I would have them followup with their PCP re rash and BP optoins

## 2016-04-10 NOTE — Telephone Encounter (Signed)
I spoke with the patient's wife. I advised her of Dr. Olin Pia recommendations to have the patient follow up with his PCP regarding his rash and potential medication changes for his BP since rash could be related to labetolol. Advised rash not related to PPM. The patient's wife voices understanding.

## 2016-04-29 ENCOUNTER — Ambulatory Visit (INDEPENDENT_AMBULATORY_CARE_PROVIDER_SITE_OTHER): Payer: Medicare Other | Admitting: *Deleted

## 2016-04-29 DIAGNOSIS — I442 Atrioventricular block, complete: Secondary | ICD-10-CM

## 2016-04-29 NOTE — Progress Notes (Signed)
Remote pacemaker transmission.   

## 2016-04-30 ENCOUNTER — Encounter: Payer: Self-pay | Admitting: Cardiology

## 2016-05-05 LAB — CUP PACEART REMOTE DEVICE CHECK
Battery Remaining Longevity: 108 mo
Battery Voltage: 3.03 V
Brady Statistic AP VP Percent: 98.47 %
Brady Statistic AS VP Percent: 1.52 %
Brady Statistic AS VS Percent: 0 %
Brady Statistic RV Percent Paced: 100 %
Implantable Lead Implant Date: 20170203
Implantable Lead Implant Date: 20170203
Implantable Lead Location: 753859
Lead Channel Impedance Value: 551 Ohm
Lead Channel Impedance Value: 627 Ohm
Lead Channel Pacing Threshold Amplitude: 0.625 V
Lead Channel Sensing Intrinsic Amplitude: 1.875 mV
Lead Channel Sensing Intrinsic Amplitude: 15.375 mV
Lead Channel Sensing Intrinsic Amplitude: 15.375 mV
Lead Channel Setting Pacing Amplitude: 2 V
MDC IDC LEAD LOCATION: 753860
MDC IDC MSMT LEADCHNL RA IMPEDANCE VALUE: 418 Ohm
MDC IDC MSMT LEADCHNL RA IMPEDANCE VALUE: 570 Ohm
MDC IDC MSMT LEADCHNL RA PACING THRESHOLD PULSEWIDTH: 0.4 ms
MDC IDC MSMT LEADCHNL RA SENSING INTR AMPL: 1.875 mV
MDC IDC MSMT LEADCHNL RV PACING THRESHOLD AMPLITUDE: 0.625 V
MDC IDC MSMT LEADCHNL RV PACING THRESHOLD PULSEWIDTH: 0.4 ms
MDC IDC SESS DTM: 20170808133600
MDC IDC SET LEADCHNL RA PACING AMPLITUDE: 1.5 V
MDC IDC SET LEADCHNL RV PACING PULSEWIDTH: 0.4 ms
MDC IDC SET LEADCHNL RV SENSING SENSITIVITY: 0.9 mV
MDC IDC STAT BRADY AP VS PERCENT: 0 %
MDC IDC STAT BRADY RA PERCENT PACED: 98.47 %

## 2016-07-29 ENCOUNTER — Ambulatory Visit (INDEPENDENT_AMBULATORY_CARE_PROVIDER_SITE_OTHER): Payer: Medicare Other | Admitting: *Deleted

## 2016-07-29 DIAGNOSIS — I442 Atrioventricular block, complete: Secondary | ICD-10-CM | POA: Diagnosis not present

## 2016-07-29 NOTE — Progress Notes (Signed)
Remote pacemaker transmission.   

## 2016-07-30 ENCOUNTER — Encounter: Payer: Self-pay | Admitting: Cardiology

## 2016-08-26 LAB — CUP PACEART REMOTE DEVICE CHECK
Battery Voltage: 3.02 V
Brady Statistic AP VP Percent: 94.16 %
Brady Statistic AS VS Percent: 0 %
Brady Statistic RV Percent Paced: 100 %
Implantable Lead Implant Date: 20170203
Implantable Lead Location: 753859
Implantable Pulse Generator Implant Date: 20170203
Lead Channel Impedance Value: 513 Ohm
Lead Channel Impedance Value: 513 Ohm
Lead Channel Impedance Value: 608 Ohm
Lead Channel Pacing Threshold Amplitude: 0.625 V
Lead Channel Pacing Threshold Amplitude: 0.75 V
Lead Channel Setting Pacing Amplitude: 1.5 V
Lead Channel Setting Pacing Amplitude: 2 V
Lead Channel Setting Pacing Pulse Width: 0.4 ms
Lead Channel Setting Sensing Sensitivity: 0.9 mV
MDC IDC LEAD IMPLANT DT: 20170203
MDC IDC LEAD LOCATION: 753860
MDC IDC MSMT BATTERY REMAINING LONGEVITY: 105 mo
MDC IDC MSMT LEADCHNL RA IMPEDANCE VALUE: 361 Ohm
MDC IDC MSMT LEADCHNL RA PACING THRESHOLD PULSEWIDTH: 0.4 ms
MDC IDC MSMT LEADCHNL RA SENSING INTR AMPL: 1.375 mV
MDC IDC MSMT LEADCHNL RA SENSING INTR AMPL: 1.375 mV
MDC IDC MSMT LEADCHNL RV PACING THRESHOLD PULSEWIDTH: 0.4 ms
MDC IDC MSMT LEADCHNL RV SENSING INTR AMPL: 15.375 mV
MDC IDC MSMT LEADCHNL RV SENSING INTR AMPL: 15.375 mV
MDC IDC SESS DTM: 20171107155712
MDC IDC STAT BRADY AP VS PERCENT: 0 %
MDC IDC STAT BRADY AS VP PERCENT: 5.84 %
MDC IDC STAT BRADY RA PERCENT PACED: 94.16 %

## 2016-10-24 DIAGNOSIS — J069 Acute upper respiratory infection, unspecified: Secondary | ICD-10-CM | POA: Diagnosis not present

## 2016-11-03 ENCOUNTER — Encounter: Payer: Medicare Other | Admitting: Nurse Practitioner

## 2016-11-11 NOTE — Progress Notes (Signed)
Electrophysiology Office Note Date: 11/13/2016  ID:  LEEMAN KERWICK, DOB 01-20-1935, MRN [id number]  PCP: Donnie Coffin, MD Electrophysiologist: Caryl Comes  CC: Pacemaker follow-up  Sean Morrison is a 82 y.o. male seen today for Dr Caryl Comes.  He presents today for routine electrophysiology followup.  Since last being seen in our clinic, the patient reports doing very well. He continues to have atypical chest pain.  He denies palpitations, dyspnea, PND, orthopnea, nausea, vomiting, dizziness, syncope, edema, weight gain, or early satiety.  Device History: MDT dual chamber PPM implanted 2017 for complete heart block    Past Medical History:  Diagnosis Date  . Complete heart block ([place]) 09/2015   a. s/p MDT MRI compatible dual chamber PPM   . Diverticulitis   . Elevated cholesterol   . Essential hypertension 10/27/2015  . Liposarcoma of chest wall [facility])    Past Surgical History:  Procedure Laterality Date  . EP IMPLANTABLE DEVICE N/A 10/26/2015   MDT MRI compatible dual chamber PPM implanted by Dr Caryl Comes for CHB   . TUMOR EXCISION      Current Outpatient Prescriptions  Medication Sig Dispense Refill  . albuterol (PROVENTIL HFA) 108 (90 Base) MCG/ACT inhaler Inhale 2 puffs into the lungs every 4 (four) hours as needed for wheezing or shortness of breath.    . butalbital-aspirin-caffeine-codeine (FIORINAL/CODEINE #3) 50-325-40-30 MG capsule Take 1 capsule by mouth every 4 (four) hours as needed for pain.    . cetirizine (ZYRTEC) 10 MG tablet Take 10 mg by mouth daily.    . Cholecalciferol (VITAMIN D3) 2000 units TABS Take 2,000 Units by mouth daily.     . Coenzyme Q10 (COQ10) 100 MG CAPS Take 100 mg by mouth daily.     . diazepam (VALIUM) 5 MG tablet Take 5 mg by mouth every 6 (six) hours as needed for anxiety.    Marland Kitchen doxycycline (VIBRAMYCIN) 100 MG capsule Take 100 mg by mouth 2 (two) times daily.    . fenofibrate micronized (LOFIBRA) 134 MG capsule Take 134 mg by mouth every evening.     .  fluocinonide cream (LIDEX) AB-123456789 % Apply 1 application topically as needed.    Marland Kitchen guaiFENesin (MUCINEX) 600 MG 12 hr tablet Take 1,200 mg by mouth 2 (two) times daily.    Marland Kitchen Hawthorne Berry 550 MG CAPS Take 550 mg by mouth daily.     Marland Kitchen labetalol (NORMODYNE) 200 MG tablet Take 1 tablet (200 mg total) by mouth 2 (two) times daily. 60 tablet 11  . Maca Root 500 MG CAPS Take 500 mg by mouth daily.     . meloxicam (MOBIC) 15 MG tablet Take 15 mg by mouth daily as needed for pain.     Marland Kitchen nystatin-triamcinolone (MYCOLOG II) cream Apply 1 application topically 2 (two) times daily as needed (rash).   1  . Omega-3 Fatty Acids (FISH OIL) 1000 MG CAPS Take 1,000 mg by mouth daily.     . sildenafil (REVATIO) 20 MG tablet TAKE 2-5 TABLETS BY MOUTH AS NEEDED    . simvastatin (ZOCOR) 20 MG tablet Take 20 mg by mouth daily.     No current facility-administered medications for this visit.     Allergies:   Patient has no known allergies.   Social History: Social History   Social History  . Marital status: Single    Spouse name: N/A  . Number of children: N/A  . Years of education: N/A   Occupational History  . Not on  file.   Social History Main Topics  . Smoking status: Former Smoker    Types: Cigarettes    Quit date: 10/04/1968  . Smokeless tobacco: Never Used  . Alcohol use No  . Drug use: No  . Sexual activity: Not on file   Other Topics Concern  . Not on file   Social History Narrative  . No narrative on file    Family History: Family History  Problem Relation Age of Onset  . Lung cancer Mother   . Heart failure Father      Review of Systems: All other systems reviewed and are otherwise negative except as noted above.   Physical Exam: VS:  There were no vitals taken for this visit. , BMI There is no height or weight on file to calculate BMI.  GEN- The patient is well appearing, alert and oriented x 3 today.   HEENT: normocephalic, atraumatic; sclera clear, conjunctiva pink;  hearing intact; oropharynx clear; neck supple Lungs- Clear to ausculation bilaterally, normal work of breathing.  No wheezes, rales, rhonchi Heart- Regular rate and rhythm (paced) GI- soft, non-tender, non-distended, bowel sounds present Extremities- no clubbing, cyanosis, +dependent BLE edema MS- no significant deformity or atrophy Skin- warm and dry, no rash or lesion; PPM pocket well healed Psych- euthymic mood, full affect Neuro- strength and sensation are intact  PPM Interrogation- reviewed in detail today,  See PACEART report  EKG:  EKG is not ordered today.  Recent Labs: No results found for requested labs within last 8760 hours.   Wt Readings from Last 3 Encounters:  02/06/16 192 lb (87.1 kg)  01/29/16 192 lb (87.1 kg)  10/26/15 199 lb (90.3 kg)     Other studies Reviewed: Additional studies/ records that were reviewed today include: Dr Olin Pia office notes  Assessment and Plan:  1.  Complete heart block Normal PPM function - pt is pacemaker dependent See Pace Art report No changes today  2.  Atypical chest pain Persistent  Low risk myoview 01/2016  Offered cardiac catheterization today to further evaluate. The patient is clear in his decision to decline.   Current medicines are reviewed at length with the patient today.   The patient does not have concerns regarding his medicines.  The following changes were made today:  none  Labs/ tests ordered today include:  Orders Placed This Encounter  Procedures  . CUP PACEART INCLINIC DEVICE CHECK     Disposition:   Follow up with [facility], Dr Caryl Comes 1 year    Signed, Chanetta Marshall, NP 11/13/2016 11:21 AM  [place] [address] [phone] (office) [phone] (fax

## 2016-11-13 ENCOUNTER — Encounter: Payer: Self-pay | Admitting: Nurse Practitioner

## 2016-11-13 ENCOUNTER — Ambulatory Visit (INDEPENDENT_AMBULATORY_CARE_PROVIDER_SITE_OTHER): Payer: PPO | Admitting: Nurse Practitioner

## 2016-11-13 DIAGNOSIS — R0789 Other chest pain: Secondary | ICD-10-CM | POA: Diagnosis not present

## 2016-11-13 DIAGNOSIS — I442 Atrioventricular block, complete: Secondary | ICD-10-CM | POA: Diagnosis not present

## 2016-11-13 LAB — CUP PACEART INCLINIC DEVICE CHECK
Date Time Interrogation Session: 20180222112101
Implantable Lead Location: 753859
MDC IDC LEAD IMPLANT DT: 20170203
MDC IDC LEAD IMPLANT DT: 20170203
MDC IDC LEAD LOCATION: 753860
MDC IDC PG IMPLANT DT: 20170203

## 2016-11-13 NOTE — Patient Instructions (Signed)
Medication Instructions:   Your physician recommends that you continue on your current medications as directed. Please refer to the Current Medication list given to you today.   If you need a refill on your cardiac medications before your next appointment, please call your pharmacy.  Labwork: NONE ORDERED  TODAY    Testing/Procedures: NONE ORDERED  TODAY    Follow-Up:  Your physician wants you to follow-up in: [place] will receive a reminder letter in the mail two months in advance. If you don't receive a letter, please call our office to schedule the follow-up appointment.   Remote monitoring is used to monitor your Pacemaker of ICD from home. This monitoring reduces the number of office visits required to check your device to one time per year. It allows [place] to keep an eye on the functioning of your device to ensure it is working properly. You are scheduled for a device check from home on .02/11/2017.You may send your transmission at any time that day. If you have a wireless device, the transmission will be sent automatically. After your physician reviews your transmission, you will receive a postcard with your next transmission date.    Any Other Special Instructions Will Be Listed Below (If Applicable).

## 2017-02-11 ENCOUNTER — Ambulatory Visit (INDEPENDENT_AMBULATORY_CARE_PROVIDER_SITE_OTHER): Payer: PPO | Admitting: *Deleted

## 2017-02-11 DIAGNOSIS — I442 Atrioventricular block, complete: Secondary | ICD-10-CM

## 2017-02-11 NOTE — Progress Notes (Signed)
Remote pacemaker transmission.   

## 2017-02-12 ENCOUNTER — Encounter: Payer: Self-pay | Admitting: Cardiology

## 2017-02-12 LAB — CUP PACEART REMOTE DEVICE CHECK
Brady Statistic AP VP Percent: 97.95 %
Brady Statistic AP VS Percent: 0 %
Brady Statistic AS VP Percent: 2.05 %
Brady Statistic AS VS Percent: 0 %
Implantable Lead Implant Date: 20170203
Implantable Lead Model: 5076
Lead Channel Impedance Value: 361 Ohm
Lead Channel Impedance Value: 475 Ohm
Lead Channel Impedance Value: 513 Ohm
Lead Channel Pacing Threshold Amplitude: 0.625 V
Lead Channel Pacing Threshold Pulse Width: 0.4 ms
Lead Channel Pacing Threshold Pulse Width: 0.4 ms
Lead Channel Sensing Intrinsic Amplitude: 1.5 mV
Lead Channel Sensing Intrinsic Amplitude: 31.625 mV
Lead Channel Setting Pacing Amplitude: 2 V
Lead Channel Setting Pacing Pulse Width: 0.4 ms
MDC IDC LEAD IMPLANT DT: 20170203
MDC IDC LEAD LOCATION: 753859
MDC IDC LEAD LOCATION: 753860
MDC IDC MSMT BATTERY REMAINING LONGEVITY: 95 mo
MDC IDC MSMT BATTERY VOLTAGE: 3.01 V
MDC IDC MSMT LEADCHNL RA PACING THRESHOLD AMPLITUDE: 0.625 V
MDC IDC MSMT LEADCHNL RA SENSING INTR AMPL: 1.5 mV
MDC IDC MSMT LEADCHNL RV IMPEDANCE VALUE: 589 Ohm
MDC IDC MSMT LEADCHNL RV SENSING INTR AMPL: 31.625 mV
MDC IDC PG IMPLANT DT: 20170203
MDC IDC SESS DTM: 20180523184157
MDC IDC SET LEADCHNL RV PACING AMPLITUDE: 2.5 V
MDC IDC SET LEADCHNL RV SENSING SENSITIVITY: 0.9 mV
MDC IDC STAT BRADY RA PERCENT PACED: 97.94 %
MDC IDC STAT BRADY RV PERCENT PACED: 99.97 %

## 2017-05-13 ENCOUNTER — Ambulatory Visit (INDEPENDENT_AMBULATORY_CARE_PROVIDER_SITE_OTHER): Payer: PPO | Admitting: *Deleted

## 2017-05-13 DIAGNOSIS — I442 Atrioventricular block, complete: Secondary | ICD-10-CM

## 2017-05-13 NOTE — Progress Notes (Signed)
Remote pacemaker transmission.   

## 2017-05-15 LAB — CUP PACEART REMOTE DEVICE CHECK
Brady Statistic AP VS Percent: 0 %
Implantable Lead Implant Date: 20170203
Implantable Lead Location: 753860
Implantable Lead Model: 5076
Lead Channel Impedance Value: 494 Ohm
Lead Channel Impedance Value: 570 Ohm
Lead Channel Pacing Threshold Amplitude: 0.625 V
Lead Channel Pacing Threshold Pulse Width: 0.4 ms
Lead Channel Pacing Threshold Pulse Width: 0.4 ms
Lead Channel Setting Pacing Amplitude: 2 V
Lead Channel Setting Sensing Sensitivity: 0.9 mV
MDC IDC LEAD IMPLANT DT: 20170203
MDC IDC LEAD LOCATION: 753859
MDC IDC MSMT BATTERY REMAINING LONGEVITY: 90 mo
MDC IDC MSMT BATTERY VOLTAGE: 3.01 V
MDC IDC MSMT LEADCHNL RA IMPEDANCE VALUE: 380 Ohm
MDC IDC MSMT LEADCHNL RA IMPEDANCE VALUE: 494 Ohm
MDC IDC MSMT LEADCHNL RA SENSING INTR AMPL: 1.75 mV
MDC IDC MSMT LEADCHNL RA SENSING INTR AMPL: 1.75 mV
MDC IDC MSMT LEADCHNL RV PACING THRESHOLD AMPLITUDE: 0.625 V
MDC IDC MSMT LEADCHNL RV SENSING INTR AMPL: 31.625 mV
MDC IDC MSMT LEADCHNL RV SENSING INTR AMPL: 31.625 mV
MDC IDC PG IMPLANT DT: 20170203
MDC IDC SESS DTM: 20180822120755
MDC IDC SET LEADCHNL RV PACING AMPLITUDE: 2.5 V
MDC IDC SET LEADCHNL RV PACING PULSEWIDTH: 0.4 ms
MDC IDC STAT BRADY AP VP PERCENT: 97.8 %
MDC IDC STAT BRADY AS VP PERCENT: 2.2 %
MDC IDC STAT BRADY AS VS PERCENT: 0 %
MDC IDC STAT BRADY RA PERCENT PACED: 97.79 %
MDC IDC STAT BRADY RV PERCENT PACED: 99.99 %

## 2017-05-22 ENCOUNTER — Encounter: Payer: Self-pay | Admitting: Cardiology

## 2017-06-15 DIAGNOSIS — Z23 Encounter for immunization: Secondary | ICD-10-CM | POA: Diagnosis not present

## 2017-08-12 ENCOUNTER — Ambulatory Visit (INDEPENDENT_AMBULATORY_CARE_PROVIDER_SITE_OTHER): Payer: PPO | Admitting: *Deleted

## 2017-08-12 DIAGNOSIS — I442 Atrioventricular block, complete: Secondary | ICD-10-CM

## 2017-08-12 NOTE — Progress Notes (Signed)
Remote pacemaker transmission.   

## 2017-08-21 ENCOUNTER — Encounter: Payer: Self-pay | Admitting: Cardiology

## 2017-08-25 DIAGNOSIS — I442 Atrioventricular block, complete: Secondary | ICD-10-CM | POA: Diagnosis not present

## 2017-08-25 DIAGNOSIS — R413 Other amnesia: Secondary | ICD-10-CM | POA: Diagnosis not present

## 2017-08-25 DIAGNOSIS — N529 Male erectile dysfunction, unspecified: Secondary | ICD-10-CM | POA: Diagnosis not present

## 2017-08-25 DIAGNOSIS — R350 Frequency of micturition: Secondary | ICD-10-CM | POA: Diagnosis not present

## 2017-08-25 DIAGNOSIS — E782 Mixed hyperlipidemia: Secondary | ICD-10-CM | POA: Diagnosis not present

## 2017-08-25 DIAGNOSIS — L290 Pruritus ani: Secondary | ICD-10-CM | POA: Diagnosis not present

## 2017-08-25 DIAGNOSIS — Z23 Encounter for immunization: Secondary | ICD-10-CM | POA: Diagnosis not present

## 2017-08-28 DIAGNOSIS — H524 Presbyopia: Secondary | ICD-10-CM | POA: Diagnosis not present

## 2017-08-28 DIAGNOSIS — H2513 Age-related nuclear cataract, bilateral: Secondary | ICD-10-CM | POA: Diagnosis not present

## 2017-08-31 LAB — CUP PACEART REMOTE DEVICE CHECK
Battery Remaining Longevity: 91 mo
Battery Voltage: 3.01 V
Brady Statistic AS VP Percent: 3.06 %
Brady Statistic RA Percent Paced: 96.92 %
Date Time Interrogation Session: 20181121173108
Implantable Lead Implant Date: 20170203
Implantable Lead Location: 753860
Implantable Lead Model: 5076
Lead Channel Impedance Value: 361 Ohm
Lead Channel Pacing Threshold Amplitude: 0.5 V
Lead Channel Pacing Threshold Pulse Width: 0.4 ms
Lead Channel Pacing Threshold Pulse Width: 0.4 ms
Lead Channel Sensing Intrinsic Amplitude: 1.75 mV
Lead Channel Sensing Intrinsic Amplitude: 1.75 mV
Lead Channel Sensing Intrinsic Amplitude: 21.375 mV
MDC IDC LEAD IMPLANT DT: 20170203
MDC IDC LEAD LOCATION: 753859
MDC IDC MSMT LEADCHNL RA IMPEDANCE VALUE: 494 Ohm
MDC IDC MSMT LEADCHNL RV IMPEDANCE VALUE: 532 Ohm
MDC IDC MSMT LEADCHNL RV IMPEDANCE VALUE: 608 Ohm
MDC IDC MSMT LEADCHNL RV PACING THRESHOLD AMPLITUDE: 0.625 V
MDC IDC MSMT LEADCHNL RV SENSING INTR AMPL: 21.375 mV
MDC IDC PG IMPLANT DT: 20170203
MDC IDC SET LEADCHNL RA PACING AMPLITUDE: 2 V
MDC IDC SET LEADCHNL RV PACING AMPLITUDE: 2.5 V
MDC IDC SET LEADCHNL RV PACING PULSEWIDTH: 0.4 ms
MDC IDC SET LEADCHNL RV SENSING SENSITIVITY: 0.9 mV
MDC IDC STAT BRADY AP VP PERCENT: 96.94 %
MDC IDC STAT BRADY AP VS PERCENT: 0 %
MDC IDC STAT BRADY AS VS PERCENT: 0 %
MDC IDC STAT BRADY RV PERCENT PACED: 99.98 %

## 2017-10-06 IMAGING — NM NM MISC PROCEDURE
3 series · 18 of 18 positions shown · non-contrast
Comparison: none

[Series 1: wbr_s-proj_st stress_(id)_sa · 6.5mm · 6.51mm/px · 6 of 512 frames shown (1 of 2)]
[frame 43/512]
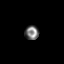
[frame 128/512]
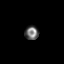
[frame 214/512]
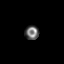
[frame 299/512]
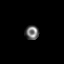
[frame 384/512]
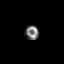
[frame 470/512]
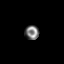

[Series 1: wbr_r-proj_st rest_(id)_sa · 6.5mm · 6.51mm/px · 6 of 64 frames shown]
[frame 6/64]
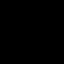
[frame 16/64]
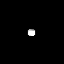
[frame 27/64]
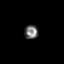
[frame 38/64]
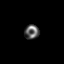
[frame 48/64]
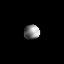
[frame 59/64]
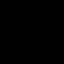

[Series 1: wbr_s-proj_st stress_(id)_sa · 6.5mm · 6.51mm/px · 6 of 64 frames shown (2 of 2)]
[frame 6/64]
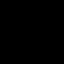
[frame 16/64]
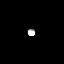
[frame 27/64]
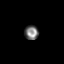
[frame 38/64]
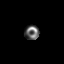
[frame 48/64]
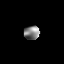
[frame 59/64]
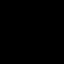

[18 of 18 positions shown; findings below may reference images not displayed]

Canned report from images found in remote index.

Refer to host system for actual result text.

## 2017-10-28 DIAGNOSIS — R300 Dysuria: Secondary | ICD-10-CM | POA: Diagnosis not present

## 2017-10-28 DIAGNOSIS — M545 Low back pain: Secondary | ICD-10-CM | POA: Diagnosis not present

## 2017-11-11 ENCOUNTER — Ambulatory Visit (INDEPENDENT_AMBULATORY_CARE_PROVIDER_SITE_OTHER): Payer: PPO | Admitting: *Deleted

## 2017-11-11 DIAGNOSIS — I442 Atrioventricular block, complete: Secondary | ICD-10-CM

## 2017-11-11 NOTE — Progress Notes (Signed)
Remote pacemaker transmission.   

## 2017-11-12 ENCOUNTER — Encounter: Payer: Self-pay | Admitting: Cardiology

## 2017-11-13 LAB — CUP PACEART REMOTE DEVICE CHECK
Battery Remaining Longevity: 84 mo
Battery Voltage: 3.01 V
Brady Statistic AP VS Percent: 0.01 %
Brady Statistic AS VS Percent: 0 %
Date Time Interrogation Session: 20190220170645
Implantable Lead Implant Date: 20170203
Implantable Lead Location: 753859
Implantable Lead Model: 5076
Implantable Pulse Generator Implant Date: 20170203
Lead Channel Impedance Value: 513 Ohm
Lead Channel Impedance Value: 608 Ohm
Lead Channel Pacing Threshold Amplitude: 0.5 V
Lead Channel Pacing Threshold Amplitude: 0.625 V
Lead Channel Pacing Threshold Pulse Width: 0.4 ms
Lead Channel Sensing Intrinsic Amplitude: 1.75 mV
Lead Channel Sensing Intrinsic Amplitude: 1.75 mV
Lead Channel Sensing Intrinsic Amplitude: 31.625 mV
Lead Channel Setting Pacing Amplitude: 2.5 V
Lead Channel Setting Sensing Sensitivity: 0.9 mV
MDC IDC LEAD IMPLANT DT: 20170203
MDC IDC LEAD LOCATION: 753860
MDC IDC MSMT LEADCHNL RA IMPEDANCE VALUE: 399 Ohm
MDC IDC MSMT LEADCHNL RA PACING THRESHOLD PULSEWIDTH: 0.4 ms
MDC IDC MSMT LEADCHNL RV IMPEDANCE VALUE: 532 Ohm
MDC IDC MSMT LEADCHNL RV SENSING INTR AMPL: 31.625 mV
MDC IDC SET LEADCHNL RA PACING AMPLITUDE: 2 V
MDC IDC SET LEADCHNL RV PACING PULSEWIDTH: 0.4 ms
MDC IDC STAT BRADY AP VP PERCENT: 95.39 %
MDC IDC STAT BRADY AS VP PERCENT: 4.6 %
MDC IDC STAT BRADY RA PERCENT PACED: 95.29 %
MDC IDC STAT BRADY RV PERCENT PACED: 99.66 %

## 2017-11-19 DIAGNOSIS — J309 Allergic rhinitis, unspecified: Secondary | ICD-10-CM | POA: Diagnosis not present

## 2017-11-23 ENCOUNTER — Ambulatory Visit: Payer: PPO | Admitting: Internal Medicine

## 2017-11-23 ENCOUNTER — Encounter: Payer: Self-pay | Admitting: Internal Medicine

## 2017-11-23 VITALS — BP 130/90 | HR 75 | Ht 70.0 in | Wt 206.0 lb

## 2017-11-23 DIAGNOSIS — Z950 Presence of cardiac pacemaker: Secondary | ICD-10-CM | POA: Diagnosis not present

## 2017-11-23 DIAGNOSIS — I442 Atrioventricular block, complete: Secondary | ICD-10-CM | POA: Diagnosis not present

## 2017-11-23 MED ORDER — FUROSEMIDE 20 MG PO TABS: 20.0000 mg | ORAL_TABLET | Freq: Every day | ORAL | 11 refills | Status: DC | PRN

## 2017-11-23 NOTE — Progress Notes (Signed)
Patient Care Team: Alroy Dust, L.Marlou Sa, MD as PCP - General (Family Medicine)   HPI  Sean Morrison is a 83 y.o. male Seen in follow-up for pacemaker implanted 2/17 for complete heart block.(Medtronic MRI)  Echocardiogram 1/17 to demonstrated normal LV function was  DATE TEST EF   1/17 Echo   65 %  LAE (54/2.4/40) Severe pulmonary Htn--70sys  5/17 Myoview  54 % No ischemia        He has had problems recently with lower extremity bilateral swelling; he says that when his cowboy boots get too hard to get on he knows he should cut down on his salt; he denies chest pain no  his fluid intake and dietary salt intake is exorbitant  Has no nocturnal dyspnea orthopnea.  No palpitations.  He has had problems recently records and Results Reviewed  Past Medical History:  Diagnosis Date  . Complete heart block ([place]) 09/2015   a. s/p MDT MRI compatible dual chamber PPM   . Diverticulitis   . Elevated cholesterol   . Essential hypertension 10/27/2015  . Liposarcoma of chest wall [facility])     Past Surgical History:  Procedure Laterality Date  . EP IMPLANTABLE DEVICE N/A 10/26/2015   MDT MRI compatible dual chamber PPM implanted by Dr Caryl Comes for CHB   . TUMOR EXCISION      Current Outpatient Medications  Medication Sig Dispense Refill  . albuterol (PROVENTIL HFA) 108 (90 Base) MCG/ACT inhaler Inhale 2 puffs into the lungs every 4 (four) hours as needed for wheezing or shortness of breath.    . butalbital-aspirin-caffeine-codeine (FIORINAL/CODEINE #3) 50-325-40-30 MG capsule Take 1 capsule by mouth every 4 (four) hours as needed for pain.    . cetirizine (ZYRTEC) 10 MG tablet Take 10 mg by mouth daily.    . Cholecalciferol (VITAMIN D3) 2000 units TABS Take 2,000 Units by mouth daily.     . Coenzyme Q10 (COQ10) 100 MG CAPS Take 100 mg by mouth daily.     . diazepam (VALIUM) 5 MG tablet Take 5 mg by mouth every 6 (six) hours as needed for anxiety.    Marland Kitchen doxycycline (VIBRAMYCIN) 100 MG  capsule Take 100 mg by mouth 2 (two) times daily.    . fenofibrate micronized (LOFIBRA) 134 MG capsule Take 134 mg by mouth every evening.     . fluocinonide cream (LIDEX) 0.26 % Apply 1 application topically as needed.    Marland Kitchen guaiFENesin (MUCINEX) 600 MG 12 hr tablet Take 1,200 mg by mouth 2 (two) times daily.    Marland Kitchen Hawthorne Berry 550 MG CAPS Take 550 mg by mouth daily.     . Maca Root 500 MG CAPS Take 500 mg by mouth daily.     . meloxicam (MOBIC) 15 MG tablet Take 15 mg by mouth daily as needed for pain.     . metoprolol succinate (TOPROL-XL) 50 MG 24 hr tablet Take 50 mg by mouth daily. Take with or immediately following a meal.    . nystatin-triamcinolone (MYCOLOG II) cream Apply 1 application topically 2 (two) times daily as needed (rash).   1  . Omega-3 Fatty Acids (FISH OIL) 1000 MG CAPS Take 1,000 mg by mouth daily.     . sildenafil (REVATIO) 20 MG tablet TAKE 2-5 TABLETS BY MOUTH AS NEEDED    . simvastatin (ZOCOR) 20 MG tablet Take 20 mg by mouth daily.     No current facility-administered medications for this visit.  No Known Allergies    Review of Systems negative except from HPI and PMH  Physical Exam BP 130/90   Pulse 75   Ht 5\' 10"  (1.778 m)   Wt 206 lb (93.4 kg)   SpO2 97%   BMI 29.56 kg/m  Well developed and nourished in no acute distress HENT normal Neck supple with JVP-8 Clear Regular rate and rhythm, no murmurs or gallops Abd-soft with active BS No Clubbing cyanosis 1-2+edema Skin-warm and dry A & Oriented  Grossly normal sensory and motor function   ECG AV pacing at 64  Assessment and  Plan  Complete heart block  Hypertension  HFpEF  Pacemaker-Medtronic  Device function is normal.  He is volume overloaded.  We have discussed both dietary restriction of  salt and water; alternatively we could use a low-dose diuretic. he is agreed to use a diuretic to begin with with an subsequent restriction of salt and water;   Blood pressure is  reasonably controlled  At age 38 there is no indication for change in his statins for primary prevention We spent more than 50% of our >25 min visit in face to face counseling regarding the above

## 2017-11-23 NOTE — Patient Instructions (Addendum)
Medication Instructions:  Begin Lasix, 20mg  tablet once daily, as needed for swelling.   Labwork: None ordered.  Testing/Procedures: None ordered.  Follow-Up: Your physician recommends that you schedule a follow-up appointment in: One Year with Dr Caryl Comes  Remote monitoring is used to monitor your ICD from home. This monitoring reduces the number of office visits required to check your device to one time per year. It allows [place] to keep an eye on the functioning of your device to ensure it is working properly. You are scheduled for a device check from home on 02/10/2018. You may send your transmission at any time that day. If you have a wireless device, the transmission will be sent automatically. After your physician reviews your transmission, you will receive a postcard with your next transmission date.   Any Other Special Instructions Will Be Listed Below (If Applicable).     If you need a refill on your cardiac medications before your next appointment, please call your pharmacy.

## 2017-11-26 LAB — CUP PACEART INCLINIC DEVICE CHECK
Battery Remaining Longevity: 83 mo
Battery Voltage: 3.01 V
Brady Statistic RA Percent Paced: 96.93 %
Date Time Interrogation Session: 20190304150208
Implantable Lead Implant Date: 20170203
Implantable Lead Location: 753860
Implantable Lead Model: 5076
Implantable Lead Model: 5076
Implantable Pulse Generator Implant Date: 20170203
Lead Channel Impedance Value: 399 Ohm
Lead Channel Pacing Threshold Pulse Width: 0.4 ms
Lead Channel Pacing Threshold Pulse Width: 0.4 ms
Lead Channel Sensing Intrinsic Amplitude: 0.75 mV
Lead Channel Sensing Intrinsic Amplitude: 31.625 mV
Lead Channel Setting Pacing Amplitude: 2.5 V
Lead Channel Setting Pacing Pulse Width: 0.4 ms
MDC IDC LEAD IMPLANT DT: 20170203
MDC IDC LEAD LOCATION: 753859
MDC IDC MSMT LEADCHNL RA IMPEDANCE VALUE: 513 Ohm
MDC IDC MSMT LEADCHNL RA PACING THRESHOLD AMPLITUDE: 0.625 V
MDC IDC MSMT LEADCHNL RA SENSING INTR AMPL: 1.875 mV
MDC IDC MSMT LEADCHNL RV IMPEDANCE VALUE: 513 Ohm
MDC IDC MSMT LEADCHNL RV IMPEDANCE VALUE: 570 Ohm
MDC IDC MSMT LEADCHNL RV PACING THRESHOLD AMPLITUDE: 0.625 V
MDC IDC MSMT LEADCHNL RV SENSING INTR AMPL: 31.625 mV
MDC IDC SET LEADCHNL RA PACING AMPLITUDE: 2 V
MDC IDC SET LEADCHNL RV SENSING SENSITIVITY: 0.9 mV
MDC IDC STAT BRADY AP VP PERCENT: 96.97 %
MDC IDC STAT BRADY AP VS PERCENT: 0 %
MDC IDC STAT BRADY AS VP PERCENT: 3.03 %
MDC IDC STAT BRADY AS VS PERCENT: 0 %
MDC IDC STAT BRADY RV PERCENT PACED: 99.9 %

## 2018-02-10 ENCOUNTER — Ambulatory Visit (INDEPENDENT_AMBULATORY_CARE_PROVIDER_SITE_OTHER): Payer: PPO | Admitting: *Deleted

## 2018-02-10 DIAGNOSIS — I442 Atrioventricular block, complete: Secondary | ICD-10-CM

## 2018-02-10 DIAGNOSIS — Z950 Presence of cardiac pacemaker: Secondary | ICD-10-CM

## 2018-02-10 NOTE — Progress Notes (Signed)
Remote pacemaker transmission.   

## 2018-02-27 LAB — CUP PACEART REMOTE DEVICE CHECK
Battery Remaining Longevity: 78 mo
Battery Voltage: 3 V
Brady Statistic AP VP Percent: 95.47 %
Brady Statistic AP VS Percent: 0 %
Brady Statistic AS VS Percent: 0 %
Brady Statistic RV Percent Paced: 99.99 %
Implantable Lead Implant Date: 20170203
Implantable Lead Location: 753859
Implantable Lead Model: 5076
Lead Channel Impedance Value: 380 Ohm
Lead Channel Impedance Value: 494 Ohm
Lead Channel Impedance Value: 513 Ohm
Lead Channel Impedance Value: 589 Ohm
Lead Channel Pacing Threshold Amplitude: 0.625 V
Lead Channel Pacing Threshold Amplitude: 0.75 V
Lead Channel Pacing Threshold Pulse Width: 0.4 ms
Lead Channel Sensing Intrinsic Amplitude: 2.375 mV
Lead Channel Sensing Intrinsic Amplitude: 28.625 mV
Lead Channel Setting Pacing Amplitude: 2 V
Lead Channel Setting Pacing Amplitude: 2.5 V
Lead Channel Setting Sensing Sensitivity: 0.9 mV
MDC IDC LEAD IMPLANT DT: 20170203
MDC IDC LEAD LOCATION: 753860
MDC IDC MSMT LEADCHNL RA PACING THRESHOLD PULSEWIDTH: 0.4 ms
MDC IDC MSMT LEADCHNL RA SENSING INTR AMPL: 2.375 mV
MDC IDC MSMT LEADCHNL RV SENSING INTR AMPL: 28.625 mV
MDC IDC PG IMPLANT DT: 20170203
MDC IDC SESS DTM: 20190522133215
MDC IDC SET LEADCHNL RV PACING PULSEWIDTH: 0.4 ms
MDC IDC STAT BRADY AS VP PERCENT: 4.53 %
MDC IDC STAT BRADY RA PERCENT PACED: 95.44 %

## 2018-05-12 ENCOUNTER — Ambulatory Visit (INDEPENDENT_AMBULATORY_CARE_PROVIDER_SITE_OTHER): Payer: PPO | Admitting: *Deleted

## 2018-05-12 DIAGNOSIS — I442 Atrioventricular block, complete: Secondary | ICD-10-CM

## 2018-05-12 NOTE — Progress Notes (Signed)
Remote pacemaker transmission.   

## 2018-05-13 ENCOUNTER — Other Ambulatory Visit (HOSPITAL_COMMUNITY): Payer: Self-pay | Admitting: Family Medicine

## 2018-05-13 DIAGNOSIS — R413 Other amnesia: Secondary | ICD-10-CM | POA: Diagnosis not present

## 2018-05-13 DIAGNOSIS — I442 Atrioventricular block, complete: Secondary | ICD-10-CM | POA: Diagnosis not present

## 2018-05-13 DIAGNOSIS — E782 Mixed hyperlipidemia: Secondary | ICD-10-CM | POA: Diagnosis not present

## 2018-05-14 ENCOUNTER — Encounter: Payer: Self-pay | Admitting: Cardiology

## 2018-05-26 ENCOUNTER — Ambulatory Visit (HOSPITAL_COMMUNITY)
Admission: RE | Admit: 2018-05-26 | Discharge: 2018-05-26 | Disposition: A | Discharge status: Home / Self Care | Payer: PPO | Source: Ambulatory Visit | Attending: Family Medicine | Admitting: Family Medicine

## 2018-05-26 DIAGNOSIS — I6782 Cerebral ischemia: Secondary | ICD-10-CM | POA: Diagnosis not present

## 2018-05-26 DIAGNOSIS — R413 Other amnesia: Secondary | ICD-10-CM | POA: Diagnosis not present

## 2018-05-26 DIAGNOSIS — G319 Degenerative disease of nervous system, unspecified: Secondary | ICD-10-CM | POA: Insufficient documentation

## 2018-05-26 MED ORDER — GADOBENATE DIMEGLUMINE 529 MG/ML IV SOLN
20.0000 mL | Freq: Once | INTRAVENOUS | Status: AC | PRN
  Administered 2018-05-26: 20 mL via INTRAVENOUS

## 2018-06-14 ENCOUNTER — Encounter: Payer: Self-pay | Admitting: Neurology

## 2018-06-14 DIAGNOSIS — R413 Other amnesia: Secondary | ICD-10-CM | POA: Diagnosis not present

## 2018-06-17 LAB — CUP PACEART REMOTE DEVICE CHECK
Battery Remaining Longevity: 76 mo
Brady Statistic AP VP Percent: 94 %
Brady Statistic AS VP Percent: 6 %
Brady Statistic RA Percent Paced: 93.89 %
Brady Statistic RV Percent Paced: 99.98 %
Date Time Interrogation Session: 20190821123249
Implantable Lead Implant Date: 20170203
Implantable Lead Location: 753860
Implantable Lead Model: 5076
Implantable Pulse Generator Implant Date: 20170203
Lead Channel Impedance Value: 532 Ohm
Lead Channel Pacing Threshold Pulse Width: 0.4 ms
Lead Channel Sensing Intrinsic Amplitude: 2.25 mV
Lead Channel Sensing Intrinsic Amplitude: 2.25 mV
Lead Channel Sensing Intrinsic Amplitude: 31.625 mV
Lead Channel Setting Pacing Amplitude: 2 V
Lead Channel Setting Pacing Pulse Width: 0.4 ms
Lead Channel Setting Sensing Sensitivity: 0.9 mV
MDC IDC LEAD IMPLANT DT: 20170203
MDC IDC LEAD LOCATION: 753859
MDC IDC MSMT BATTERY VOLTAGE: 3 V
MDC IDC MSMT LEADCHNL RA IMPEDANCE VALUE: 361 Ohm
MDC IDC MSMT LEADCHNL RA IMPEDANCE VALUE: 494 Ohm
MDC IDC MSMT LEADCHNL RA PACING THRESHOLD AMPLITUDE: 0.75 V
MDC IDC MSMT LEADCHNL RV IMPEDANCE VALUE: 608 Ohm
MDC IDC MSMT LEADCHNL RV PACING THRESHOLD AMPLITUDE: 0.625 V
MDC IDC MSMT LEADCHNL RV PACING THRESHOLD PULSEWIDTH: 0.4 ms
MDC IDC MSMT LEADCHNL RV SENSING INTR AMPL: 31.625 mV
MDC IDC SET LEADCHNL RV PACING AMPLITUDE: 2.5 V
MDC IDC STAT BRADY AP VS PERCENT: 0 %
MDC IDC STAT BRADY AS VS PERCENT: 0 %

## 2018-07-06 NOTE — Progress Notes (Signed)
NEUROLOGY CONSULTATION NOTE  RASHEED WELTY MRN: [id number] DOB: 07-Apr-1935  Referring provider: Donavan Burnet, MD Primary care provider: Donavan Burnet, MD  Reason for consult:  Memory deficits  HISTORY OF PRESENT ILLNESS: Sean Morrison is an 83 year old right-handed male with complete heart block s/p MRI compatible PPM, HTN and hyperlipidemia who presents for memory deficits.  He is accompanied by his wife who supplements history.  In the fall of 2016, he had a severe respiratory infection.  During that time, he was delirious.  He would walk around the house at night and would hallucinate. He had a PPM in February 2017 for complete heart block.  While he was in the hospital prior to surgery, he had an episode of agitation.  Since then, he has had a gradual decline.  He has been hallucinating, such as children playing or people not there.  He has short-term memory problems.  He forgets appointments and conversations.  He frequently repeats questions.  Long-term memory is better but sometimes when he talks about the past, it may not be true.  One time, he didn't seem to recognize his wife for a moment.  He needs assistance using the shower.  He has trouble operating the TV remote.  He no longer can use the telephone.  Sometimes he has trouble eating certain foods, such as how to eat a hamburger.  He doesn't always remember how to use to toilet or how to get to the bathroom in the house.  No aggression or combativeness.  He sometimes may get agitated because he forgets what he wanted to say.  He does not seem more depressed but he often says that he knows something is wrong but cannot do anything about it.  He has had poor sleep for many years.  He sleeps in a recliner because of chronic pain in the chest after surgery for a cancer.  He does not drive.  MRI of brain with and without contrast from 05/26/18 was personally reviewed and showed moderate cerebral atrophy and minimal chronic small  vessel ischemic changes.  He is currently on Namenda XR 7mg  daily since September.  He had adverse reaction to Aricept.  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  He knew the thousands of parts that they sold by memory.    His wife reports that they have living wills but this was many years ago. No known family history of dementia.  PAST MEDICAL HISTORY: Past Medical History:  Diagnosis Date  . Complete heart block ([place]) 09/2015   a. s/p MDT MRI compatible dual chamber PPM   . Diverticulitis   . Elevated cholesterol   . Essential hypertension 10/27/2015  . 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 EP IMPLANTABLE DEVICE N/A 10/26/2015   MDT MRI compatible dual chamber PPM implanted by Dr Caryl Comes for CHB   . TUMOR EXCISION      MEDICATIONS: Current Outpatient Medications on File Prior to Visit  Medication Sig Dispense Refill  . albuterol (PROVENTIL HFA) 108 (90 Base) MCG/ACT inhaler Inhale 2 puffs into the lungs every 4 (four) hours as needed for wheezing or shortness of breath.    . butalbital-aspirin-caffeine-codeine (FIORINAL/CODEINE #3) 50-325-40-30 MG capsule Take 1 capsule by mouth every 4 (four) hours as needed for pain.    . cetirizine (ZYRTEC) 10 MG  tablet Take 10 mg by mouth daily.    . Cholecalciferol (VITAMIN D3) 2000 units TABS Take 2,000 Units by mouth daily.     . Coenzyme Q10 (COQ10) 100 MG CAPS Take 100 mg by mouth daily.     . diazepam (VALIUM) 5 MG tablet Take 5 mg by mouth every 6 (six) hours as needed for anxiety.    Marland Kitchen doxycycline (VIBRAMYCIN) 100 MG capsule Take 100 mg by mouth 2 (two) times daily.    . fenofibrate micronized (LOFIBRA) 134 MG capsule Take 134 mg by mouth every evening.     . fluocinonide cream (LIDEX) 9.23 % Apply 1 application topically as needed.    . furosemide (LASIX) 20 MG tablet Take 1 tablet (20 mg total) by mouth daily  as needed. 30 tablet 11  . guaiFENesin (MUCINEX) 600 MG 12 hr tablet Take 1,200 mg by mouth 2 (two) times daily.    Marland Kitchen Hawthorne Berry 550 MG CAPS Take 550 mg by mouth daily.     . Maca Root 500 MG CAPS Take 500 mg by mouth daily.     . meloxicam (MOBIC) 15 MG tablet Take 15 mg by mouth daily as needed for pain.     . metoprolol succinate (TOPROL-XL) 50 MG 24 hr tablet Take 50 mg by mouth daily. Take with or immediately following a meal.    . nystatin-triamcinolone (MYCOLOG II) cream Apply 1 application topically 2 (two) times daily as needed (rash).   1  . Omega-3 Fatty Acids (FISH OIL) 1000 MG CAPS Take 1,000 mg by mouth daily.     . sildenafil (REVATIO) 20 MG tablet TAKE 2-5 TABLETS BY MOUTH AS NEEDED    . simvastatin (ZOCOR) 20 MG tablet Take 20 mg by mouth daily.     No current facility-administered medications on file prior to visit.     ALLERGIES: No Known Allergies  FAMILY HISTORY: Family History  Problem Relation Age of Onset  . Lung cancer Mother   . Heart failure Father    SOCIAL HISTORY: Social History   Socioeconomic History  . Marital status: Single    Spouse name: Not on file  . Number of children: Not on file  . Years of education: Not on file  . Highest education level: Not on file  Occupational History  . Not on file  Social Needs  . Financial resource strain: Not on file  . Food insecurity:    Worry: Not on file    Inability: Not on file  . Transportation needs:    Medical: Not on file    Non-medical: Not on file  Tobacco Use  . Smoking status: Former Smoker    Types: Cigarettes    Last attempt to quit: 10/04/1968    Years since quitting: 49.7  . Smokeless tobacco: Never Used  Substance and Sexual Activity  . Alcohol use: No  . Drug use: No  . Sexual activity: Not on file  Lifestyle  . Physical activity:    Days per week: Not on file    Minutes per session: Not on file  . Stress: Not on file  Relationships  . Social connections:    Talks on  phone: Not on file    Gets together: Not on file    Attends religious service: Not on file    Active member of club or organization: Not on file    Attends meetings of clubs or organizations: Not on file    Relationship status: Not on  file  . Intimate partner violence:    Fear of current or ex partner: Not on file    Emotionally abused: Not on file    Physically abused: Not on file    Forced sexual activity: Not on file  Other Topics Concern  . Not on file  Social History Narrative  . Not on file    REVIEW OF SYSTEMS: Constitutional: No fevers, chills, or sweats, no generalized fatigue, change in appetite Eyes: No visual changes, double vision, eye pain Ear, nose and throat: No hearing loss, ear pain, nasal congestion, sore throat Cardiovascular: No chest pain, palpitations Respiratory:  No shortness of breath at rest or with exertion, wheezes GastrointestinaI: No nausea, vomiting, diarrhea, abdominal pain, fecal incontinence Genitourinary:  No dysuria, urinary retention or frequency Musculoskeletal:  No neck pain, back pain Integumentary: No rash, pruritus, skin lesions Neurological: as above Psychiatric: No depression, insomnia, anxiety Endocrine: No palpitations, fatigue, diaphoresis, mood swings, change in appetite, change in weight, increased thirst Hematologic/Lymphatic:  No purpura, petechiae. Allergic/Immunologic: no itchy/runny eyes, nasal congestion, recent allergic reactions, rashes  PHYSICAL EXAM: Blood pressure 130/88, pulse 95, height 5\' 9"  (1.753 m), weight 190 lb (86.2 kg), SpO2 94 %. General: No acute distress.  Patient appears well-groomed.   Head:  Normocephalic/atraumatic Eyes:  fundi examined but not visualized Neck: supple, no paraspinal tenderness, full range of motion Back: No paraspinal tenderness Heart: regular rate and rhythm Lungs: Clear to auscultation bilaterally. Vascular: No carotid bruits. Neurological Exam: Mental status: alert and  oriented to person and city only (not time), recent and remote memory impaired, fund of knowledge impaired, attention and concentration impaired, speech fluent and not dysarthric, naming intact but naming fluency and following some commands impaired.  Unable to complete Trail Making Test, copy a cube or draw a clock. Montreal Cognitive Assessment  07/07/2018  Visuospatial/ Executive (0/5) 1  Naming (0/3) 3  Attention: Read list of digits (0/2) 1  Attention: Read list of letters (0/1) 0  Attention: Serial 7 subtraction starting at 100 (0/3) 0  Language: Repeat phrase (0/2) 0  Language : Fluency (0/1) 0  Abstraction (0/2) 0  Delayed Recall (0/5) 0  Orientation (0/6) 1  Total 6  Adjusted Score (based on education) 7  Cranial nerves: CN I: not tested CN II: pupils equal, round and reactive to light, visual fields intact CN III, IV, VI:  full range of motion, no nystagmus, no ptosis CN V: facial sensation intact CN VII: upper and lower face symmetric CN VIII: hearing intact CN IX, X: gag intact, uvula midline CN XI: sternocleidomastoid and trapezius muscles intact CN XII: tongue midline Bulk & Tone: normal, no fasciculations. Motor:  5/5 throughout  Sensation:  temperature sensation intact, vibration sensation mildly reduced in toes. Deep Tendon Reflexes:  2+ throughout, toes downgoing.  Finger to nose testing:  Without dysmetria.   Heel to shin:  Without dysmetria.   Gait:  Normal station and stride.  Romberg with sway.  IMPRESSION: Alzheimer's disease with significant memory deficits, hallucinations and apraxia, requiring assistance for most ADLs  PLAN: 1.  We will titrate Namenda XR to 28mg  daily 2.  Advised that he needs 24 hour supervision.   3.  Provided resources for patient and his wife.  Our social worker, Janett Billow, kindly spoke with them as well. 4.  Advised to contact an Forensic scientist who specializes in [place] and wills.  I provided a couple of contacts. 5.  Follow up in 6  months.  Thank you  for allowing me to take part in the care of this patient.  Metta Clines, DO  CC: Donavan Burnet, MD

## 2018-07-07 ENCOUNTER — Encounter: Payer: Self-pay | Admitting: Neurology

## 2018-07-07 ENCOUNTER — Ambulatory Visit (INDEPENDENT_AMBULATORY_CARE_PROVIDER_SITE_OTHER): Payer: PPO | Admitting: Neurology

## 2018-07-07 VITALS — BP 130/88 | HR 95 | Ht 69.0 in | Wt 190.0 lb

## 2018-07-07 DIAGNOSIS — G301 Alzheimer's disease with late onset: Secondary | ICD-10-CM | POA: Diagnosis not present

## 2018-07-07 DIAGNOSIS — F0280 Dementia in other diseases classified elsewhere without behavioral disturbance: Secondary | ICD-10-CM

## 2018-07-07 MED ORDER — MEMANTINE HCL ER 7 MG PO CP24: ORAL_CAPSULE | ORAL | 0 refills | Status: DC

## 2018-07-07 NOTE — Patient Instructions (Addendum)
1.  We will increase memantine ER 7mg  to 2 pills daily for 7 days, then 3 pills daily for 7 days, then 4 pills daily.  When you run out, contact us and we can refill a 28mg  daily dose. 2.  I recommend 24 hour supervision   RESOURCES: Development worker, community of [facility]: [phone]  Tel [place]:  [phone]  www.senior-resources-[place].org/resources.cfm   Resources for common questions found under "Pathways & Protocols "  www.senior-resources-[place].org/pathways/Pathways_Menu.htm   Resources for [facility] facilities:  www.ncnursinghomeguide.com   Please consider contacting one of these law firms that specialize in estate planning: For assistance with senior care, elder law, and estate planning (POA, medical directives):  [employer]  [address], [place] [address]  Tel: [phone]  [email or web address]   Berneice Heinrich  Tel: [phone]  [email or web address]    Alzheimer Disease Caregiver Guide A person who has Alzheimer disease may not be able to take care of himself or herself. He or she may need help with simple tasks. The tips below can help you care for the person. Memory loss and confusion If the person is confused or cannot remember things:  Stay calm.  Respond with a short answer.  Avoid correcting him or her in a way that sounds like scolding.  Try not to take it personally, even if he or she forgets your name.  Behavior changes The person may go through behavior changes. This can include depression, anxiety, anger, or seeing things that are not there. When behavior changes:  Try not to take behavior changes personally.  Stay calm and patient.  Do not argue or try to convince the person about a specific point.  Know that these changes are part of the disease process. Try to work through it.  Tips to lessen frustration  Make appointments and do daily tasks when the person is  at his or her best.  Take your time. Simple tasks may take longer. Allow plenty of time to complete tasks.  Limit choices for the person.  Involve the person in what you are doing.  Stick to a routine.  Avoid new or crowded places, if possible.  Use simple words, short sentences, and a calm voice. Only give 1 direction at a time.  Buy clothes and shoes that are easy to put on and take off.  Let people help if they offer. Home safety  Keep floors clear. Remove rugs, magazine racks, and floor lamps.  Keep hallways well lit.  Put a handrail and nonslip mat in the bathtub or shower.  Put childproof locks on cabinets that have dangerous items in them. These items include medicine, alcohol, guns, toxic cleaning items, sharp tools, matches, or lighters.  Place locks on doors where the person cannot see or reach them. This helps the person to not wander out of the house and get lost.  Be prepared for emergencies. Keep a list of emergency phone numbers and addresses in a handy area. Plans for the future  Talk about finances. ? Talk about money management. People with Alzheimer disease have trouble managing their money as the disease gets worse. ? Get help from professional advisors about financial and legal matters.  Talk about future care. ? Choose a power of attorney. This is someone who can make decisions for the person with Alzheimer disease when he or she can no longer do so. ? Talk about driving and when it is the  right time to stop. The person's doctor can help with this. ? If the person lives alone, make sure he or she is safe. Some people need extra help at home. Other people need more care at a nursing home or care center. Support groups Some benefits of joining a support group include:  Learning ways to manage stress.  Sharing experiences with others.  Getting emotional comfort and support.  Learning new caregiving skills as the disease progresses.  Knowing what  community resources are available and taking advantage of them.  Get help if:  The person has a fever.  The person has a sudden behavior change that does not get better with calming strategies.  The person is unable to manage his or her living situation.  The person threatens you or anyone else, including himself or herself.  You are no longer able to care for the person. This information is not intended to replace advice given to you by your health care provider. Make sure you discuss any questions you have with your health care provider. Document Released: 12/01/2011 Document Revised: 02/14/2016 Document Reviewed: 10/29/2011 Elsevier Interactive Patient Education  2017 [employer].

## 2018-07-09 DIAGNOSIS — Z23 Encounter for immunization: Secondary | ICD-10-CM | POA: Diagnosis not present

## 2018-07-23 ENCOUNTER — Telehealth: Payer: Self-pay | Admitting: Neurology

## 2018-07-23 MED ORDER — MEMANTINE HCL ER 28 MG PO CP24: 28.0000 mg | ORAL_CAPSULE | Freq: Every day | ORAL | 5 refills | Status: DC

## 2018-07-23 NOTE — Telephone Encounter (Signed)
Patient's wife is calling in stating he needs a refill on his Memantine medication sent to the [facility] . Recently increased to 28mg . Please change the pharmacy in the system also. Thanks!

## 2018-07-23 NOTE — Telephone Encounter (Signed)
RX sent to pharmacy requested  

## 2018-08-11 ENCOUNTER — Ambulatory Visit (INDEPENDENT_AMBULATORY_CARE_PROVIDER_SITE_OTHER): Payer: PPO

## 2018-08-11 DIAGNOSIS — I442 Atrioventricular block, complete: Secondary | ICD-10-CM

## 2018-08-12 NOTE — Progress Notes (Signed)
Remote pacemaker transmission.   

## 2018-08-30 DIAGNOSIS — E782 Mixed hyperlipidemia: Secondary | ICD-10-CM | POA: Diagnosis not present

## 2018-08-30 DIAGNOSIS — R413 Other amnesia: Secondary | ICD-10-CM | POA: Diagnosis not present

## 2018-09-06 DIAGNOSIS — H2513 Age-related nuclear cataract, bilateral: Secondary | ICD-10-CM | POA: Diagnosis not present

## 2018-09-06 DIAGNOSIS — H524 Presbyopia: Secondary | ICD-10-CM | POA: Diagnosis not present

## 2018-10-06 LAB — CUP PACEART REMOTE DEVICE CHECK
Battery Remaining Longevity: 74 mo
Brady Statistic AP VS Percent: 0 %
Implantable Lead Implant Date: 20170203
Implantable Lead Implant Date: 20170203
Implantable Lead Model: 5076
Implantable Pulse Generator Implant Date: 20170203
Lead Channel Impedance Value: 380 Ohm
Lead Channel Impedance Value: 513 Ohm
Lead Channel Pacing Threshold Amplitude: 0.75 V
Lead Channel Pacing Threshold Pulse Width: 0.4 ms
Lead Channel Pacing Threshold Pulse Width: 0.4 ms
Lead Channel Sensing Intrinsic Amplitude: 2.375 mV
Lead Channel Sensing Intrinsic Amplitude: 9.875 mV
Lead Channel Setting Pacing Amplitude: 2 V
MDC IDC LEAD LOCATION: 753859
MDC IDC LEAD LOCATION: 753860
MDC IDC MSMT BATTERY VOLTAGE: 3 V
MDC IDC MSMT LEADCHNL RA SENSING INTR AMPL: 2.375 mV
MDC IDC MSMT LEADCHNL RV IMPEDANCE VALUE: 513 Ohm
MDC IDC MSMT LEADCHNL RV IMPEDANCE VALUE: 570 Ohm
MDC IDC MSMT LEADCHNL RV PACING THRESHOLD AMPLITUDE: 0.625 V
MDC IDC MSMT LEADCHNL RV SENSING INTR AMPL: 9.875 mV
MDC IDC SESS DTM: 20191120145032
MDC IDC SET LEADCHNL RV PACING AMPLITUDE: 2.5 V
MDC IDC SET LEADCHNL RV PACING PULSEWIDTH: 0.4 ms
MDC IDC SET LEADCHNL RV SENSING SENSITIVITY: 0.9 mV
MDC IDC STAT BRADY AP VP PERCENT: 92.84 %
MDC IDC STAT BRADY AS VP PERCENT: 7.16 %
MDC IDC STAT BRADY AS VS PERCENT: 0 %
MDC IDC STAT BRADY RA PERCENT PACED: 92.65 %
MDC IDC STAT BRADY RV PERCENT PACED: 99.99 %

## 2018-11-04 ENCOUNTER — Encounter: Payer: Self-pay | Admitting: Internal Medicine

## 2018-11-10 ENCOUNTER — Ambulatory Visit (INDEPENDENT_AMBULATORY_CARE_PROVIDER_SITE_OTHER): Payer: PPO

## 2018-11-10 DIAGNOSIS — I442 Atrioventricular block, complete: Secondary | ICD-10-CM | POA: Diagnosis not present

## 2018-11-11 LAB — CUP PACEART REMOTE DEVICE CHECK
Battery Remaining Longevity: 73 mo
Battery Voltage: 3 V
Brady Statistic AP VP Percent: 93.74 %
Brady Statistic AP VS Percent: 0 %
Brady Statistic AS VP Percent: 6.25 %
Brady Statistic AS VS Percent: 0 %
Brady Statistic RA Percent Paced: 93.71 %
Brady Statistic RV Percent Paced: 99.94 %
Date Time Interrogation Session: 20200219150641
Implantable Lead Implant Date: 20170203
Implantable Lead Implant Date: 20170203
Implantable Lead Location: 753859
Implantable Lead Model: 5076
Implantable Lead Model: 5076
Implantable Pulse Generator Implant Date: 20170203
Lead Channel Impedance Value: 361 Ohm
Lead Channel Impedance Value: 494 Ohm
Lead Channel Impedance Value: 513 Ohm
Lead Channel Impedance Value: 570 Ohm
Lead Channel Pacing Threshold Amplitude: 0.5 V
Lead Channel Pacing Threshold Amplitude: 0.625 V
Lead Channel Pacing Threshold Pulse Width: 0.4 ms
Lead Channel Pacing Threshold Pulse Width: 0.4 ms
Lead Channel Sensing Intrinsic Amplitude: 2.25 mV
Lead Channel Sensing Intrinsic Amplitude: 2.25 mV
Lead Channel Sensing Intrinsic Amplitude: 31.625 mV
Lead Channel Sensing Intrinsic Amplitude: 31.625 mV
Lead Channel Setting Pacing Amplitude: 2 V
Lead Channel Setting Pacing Amplitude: 2.5 V
Lead Channel Setting Pacing Pulse Width: 0.4 ms
Lead Channel Setting Sensing Sensitivity: 0.9 mV
MDC IDC LEAD LOCATION: 753860

## 2018-11-17 NOTE — Progress Notes (Signed)
Remote pacemaker transmission.   

## 2018-11-19 ENCOUNTER — Encounter: Payer: Self-pay | Admitting: Cardiology

## 2018-11-22 ENCOUNTER — Ambulatory Visit: Payer: PPO | Admitting: Internal Medicine

## 2018-11-22 ENCOUNTER — Encounter: Payer: Self-pay | Admitting: Internal Medicine

## 2018-11-22 VITALS — BP 152/90 | HR 68 | Ht 69.0 in | Wt 192.6 lb

## 2018-11-22 DIAGNOSIS — I10 Essential (primary) hypertension: Secondary | ICD-10-CM | POA: Diagnosis not present

## 2018-11-22 DIAGNOSIS — I442 Atrioventricular block, complete: Secondary | ICD-10-CM | POA: Diagnosis not present

## 2018-11-22 DIAGNOSIS — Z950 Presence of cardiac pacemaker: Secondary | ICD-10-CM | POA: Diagnosis not present

## 2018-11-22 LAB — CUP PACEART INCLINIC DEVICE CHECK
Battery Remaining Longevity: 73 mo
Brady Statistic AP VP Percent: 93.37 %
Brady Statistic AP VS Percent: 0 %
Brady Statistic AS VP Percent: 6.62 %
Brady Statistic AS VS Percent: 0 %
Brady Statistic RA Percent Paced: 93.27 %
Brady Statistic RV Percent Paced: 99.97 %
Date Time Interrogation Session: 20200302123825
Implantable Lead Implant Date: 20170203
Implantable Lead Implant Date: 20170203
Implantable Lead Location: 753859
Implantable Lead Location: 753860
Implantable Lead Model: 5076
Implantable Lead Model: 5076
Implantable Pulse Generator Implant Date: 20170203
Lead Channel Impedance Value: 475 Ohm
Lead Channel Impedance Value: 475 Ohm
Lead Channel Impedance Value: 532 Ohm
Lead Channel Pacing Threshold Amplitude: 0.75 V
Lead Channel Pacing Threshold Pulse Width: 0.4 ms
Lead Channel Pacing Threshold Pulse Width: 0.4 ms
Lead Channel Sensing Intrinsic Amplitude: 2.125 mV
Lead Channel Setting Pacing Amplitude: 2 V
Lead Channel Setting Pacing Amplitude: 2.5 V
Lead Channel Setting Pacing Pulse Width: 0.4 ms
Lead Channel Setting Sensing Sensitivity: 0.9 mV
MDC IDC MSMT BATTERY VOLTAGE: 3 V
MDC IDC MSMT LEADCHNL RA IMPEDANCE VALUE: 361 Ohm
MDC IDC MSMT LEADCHNL RV PACING THRESHOLD AMPLITUDE: 0.75 V

## 2018-11-22 MED ORDER — CEPHALEXIN 500 MG PO CAPS: 500.0000 mg | ORAL_CAPSULE | Freq: Three times a day (TID) | ORAL | 0 refills | Status: AC

## 2018-11-22 MED ORDER — FUROSEMIDE 20 MG PO TABS: 20.0000 mg | ORAL_TABLET | ORAL | 3 refills | Status: DC

## 2018-11-22 MED ORDER — FUROSEMIDE 20 MG PO TABS: 20.0000 mg | ORAL_TABLET | ORAL | 3 refills | Status: AC

## 2018-11-22 MED ORDER — CEPHALEXIN 500 MG PO CAPS: 500.0000 mg | ORAL_CAPSULE | Freq: Three times a day (TID) | ORAL | 0 refills | Status: DC

## 2018-11-22 NOTE — Progress Notes (Signed)
      Patient Care Team: Alroy Dust, L.Marlou Sa, MD as PCP - General (Family Medicine)   HPI  Sean Morrison is a 84 y.o. male Seen in follow-up for pacemaker implanted 2/17 for complete heart block.(Medtronic MRI)     DATE TEST EF   1/17 Echo   65 %  LAE (54/2.4/40) Severe pulmonary Htn--70sys  5/17 Myoview  54 % No ischemia         Significant problems with lower extremity swelling but is been averse to taking his diuretics.  Denies shortness of breath or chest pain.  Intercurrently has been diagnosed with dementia.  He has had problems recently records and Results Reviewed  Past Medical History:  Diagnosis Date  . Complete heart block ([place]) 09/2015   a. s/p MDT MRI compatible dual chamber PPM   . Diverticulitis   . Elevated cholesterol   . Essential hypertension 10/27/2015  . Liposarcoma of chest wall [facility])     Past Surgical History:  Procedure Laterality Date  . EP IMPLANTABLE DEVICE N/A 10/26/2015   MDT MRI compatible dual chamber PPM implanted by Dr Caryl Comes for CHB   . TUMOR EXCISION      Current Outpatient Medications  Medication Sig Dispense Refill  . carvedilol (COREG) 6.25 MG tablet Take 6.25 mg by mouth daily.    . fenofibrate micronized (LOFIBRA) 134 MG capsule Take 134 mg by mouth every evening.     . memantine (NAMENDA XR) 28 MG CP24 24 hr capsule Take 1 capsule (28 mg total) by mouth daily. 30 capsule 5  . simvastatin (ZOCOR) 20 MG tablet Take 20 mg by mouth daily.     No current facility-administered medications for this visit.     No Known Allergies    Review of Systems negative except from HPI and PMH  Physical Exam BP (!) 152/90   Pulse 68   Ht 5\' 9"  (1.753 m)   Wt 192 lb 9.6 oz (87.4 kg)   SpO2 99%   BMI 28.44 kg/m  Well developed and well nourished in no acute distress HENT normal Neck supple with JVP-flat Clear Device pocket well healed; without hematoma or erythema.  There is no tethering  Regular rate and rhythm, no  gallop NO  murmur Abd-soft with active BS No Clubbing cyanosis 2+ L>R  Edema with right lower extremity cellulitis Skin-warm and dry A & Oriented  Grossly normal sensory and motor function    ECG  Av PACING  Assessment and  Plan  Complete heart block  Hypertension  HFpEF  Right lower extremity cellulitis  Pacemaker-Medtronic  The patient's device was interrogated.  The information was reviewed. No changes were made in the programming.     Blood pressure somewhat elevated.  We will resume his diuretic.  He has been averse to it because of frequent urination.  He is agreeable to trying it every other day.  He has been responding to the past to 20 mg.  We will start it that way.  I am concerned about the cellulitis in the context of his implanted pacemaker.  We will put him on cephalexin x10 days.  I have asked that he follow-up with his PCP.

## 2018-11-22 NOTE — Patient Instructions (Addendum)
Medication Instructions:  Your physician has recommended you make the following change in your medication:   1. Begin Cephalexin, one 500 mg capsule, every 8 hours.  2. Take Lasix, 20mg  tablet, every other day.  Labwork: None ordered.  Testing/Procedures: None ordered.  Follow-Up: Your physician recommends that you schedule a follow-up appointment in:   Dr Alroy Dust in 2 weeks  Dr Caryl Comes in One Year  Any Other Special Instructions Will Be Listed Below (If Applicable).     If you need a refill on your cardiac medications before your next appointment, please call your pharmacy.

## 2019-01-06 ENCOUNTER — Ambulatory Visit: Payer: PPO | Admitting: Neurology

## 2019-01-13 ENCOUNTER — Other Ambulatory Visit: Payer: Self-pay | Admitting: Neurology

## 2019-01-19 DIAGNOSIS — L03116 Cellulitis of left lower limb: Secondary | ICD-10-CM | POA: Diagnosis not present

## 2019-02-09 ENCOUNTER — Telehealth: Payer: Self-pay | Admitting: Internal Medicine

## 2019-02-09 ENCOUNTER — Ambulatory Visit (INDEPENDENT_AMBULATORY_CARE_PROVIDER_SITE_OTHER): Payer: PPO | Admitting: *Deleted

## 2019-02-09 ENCOUNTER — Other Ambulatory Visit: Payer: Self-pay

## 2019-02-09 DIAGNOSIS — I442 Atrioventricular block, complete: Secondary | ICD-10-CM | POA: Diagnosis not present

## 2019-02-09 LAB — CUP PACEART REMOTE DEVICE CHECK
Battery Remaining Longevity: 72 mo
Battery Voltage: 3 V
Brady Statistic AP VP Percent: 94.89 %
Brady Statistic AP VS Percent: 0 %
Brady Statistic AS VP Percent: 5.1 %
Brady Statistic AS VS Percent: 0 %
Brady Statistic RA Percent Paced: 94.85 %
Brady Statistic RV Percent Paced: 99.95 %
Date Time Interrogation Session: 20200520172507
Implantable Lead Implant Date: 20170203
Implantable Lead Implant Date: 20170203
Implantable Lead Location: 753859
Implantable Lead Location: 753860
Implantable Lead Model: 5076
Implantable Lead Model: 5076
Implantable Pulse Generator Implant Date: 20170203
Lead Channel Impedance Value: 361 Ohm
Lead Channel Impedance Value: 494 Ohm
Lead Channel Impedance Value: 494 Ohm
Lead Channel Impedance Value: 551 Ohm
Lead Channel Pacing Threshold Amplitude: 0.625 V
Lead Channel Pacing Threshold Amplitude: 0.625 V
Lead Channel Pacing Threshold Pulse Width: 0.4 ms
Lead Channel Pacing Threshold Pulse Width: 0.4 ms
Lead Channel Sensing Intrinsic Amplitude: 2.375 mV
Lead Channel Sensing Intrinsic Amplitude: 2.375 mV
Lead Channel Sensing Intrinsic Amplitude: 31.625 mV
Lead Channel Sensing Intrinsic Amplitude: 31.625 mV
Lead Channel Setting Pacing Amplitude: 2 V
Lead Channel Setting Pacing Amplitude: 2.5 V
Lead Channel Setting Pacing Pulse Width: 0.4 ms
Lead Channel Setting Sensing Sensitivity: 0.9 mV

## 2019-02-09 NOTE — Telephone Encounter (Signed)
Spoke w/ pt wife and instructed her how to send transmission. Transmission received.

## 2019-02-09 NOTE — Telephone Encounter (Signed)
Spoke w/ pt wife and she informed that they attempted to send remote transmission but it wouldn't work. Attempted to help pt wife send remote transmission. Error code 9223. Instructed pt wife to let the monitor charge and I will call back around 12:00. Pt wife verbalized understanding.

## 2019-02-09 NOTE — Telephone Encounter (Signed)
New message   Patient's wife states that the home remote transmission did not go through. Please call.

## 2019-02-16 ENCOUNTER — Other Ambulatory Visit: Payer: Self-pay | Admitting: Internal Medicine

## 2019-02-16 DIAGNOSIS — I10 Essential (primary) hypertension: Secondary | ICD-10-CM

## 2019-02-18 ENCOUNTER — Encounter: Payer: Self-pay | Admitting: Cardiology

## 2019-02-18 NOTE — Progress Notes (Signed)
Remote pacemaker transmission.   

## 2019-02-23 DIAGNOSIS — I442 Atrioventricular block, complete: Secondary | ICD-10-CM | POA: Diagnosis not present

## 2019-02-23 DIAGNOSIS — L0390 Cellulitis, unspecified: Secondary | ICD-10-CM | POA: Diagnosis not present

## 2019-02-23 DIAGNOSIS — Z0000 Encounter for general adult medical examination without abnormal findings: Secondary | ICD-10-CM | POA: Diagnosis not present

## 2019-02-23 DIAGNOSIS — E782 Mixed hyperlipidemia: Secondary | ICD-10-CM | POA: Diagnosis not present

## 2019-02-23 DIAGNOSIS — R413 Other amnesia: Secondary | ICD-10-CM | POA: Diagnosis not present

## 2019-02-25 DIAGNOSIS — L0390 Cellulitis, unspecified: Secondary | ICD-10-CM | POA: Diagnosis not present

## 2019-02-25 DIAGNOSIS — E782 Mixed hyperlipidemia: Secondary | ICD-10-CM | POA: Diagnosis not present

## 2019-03-13 NOTE — Progress Notes (Signed)
Virtual Visit via Telephone Note The purpose of this virtual visit is to provide medical care while limiting exposure to the novel coronavirus.    Consent was obtained for phone visit:  Yes Answered questions that patient had about telehealth interaction:  Yes I discussed the limitations, risks, security and privacy concerns of performing an evaluation and management service by telephone. I also discussed with the patient that there may be a patient responsible charge related to this service. The patient expressed understanding and agreed to proceed.  Pt location: Home Physician Location: Home Name of referring provider:  Alroy Dust, L.Marlou Sa, MD I connected with .[name] at patients initiation/request on 03/14/2019 at  2:50 PM EDT by telephone and verified that I am speaking with the correct person using two identifiers.  Pt MRN:  [id number] Pt DOB:  06-29-1935   History of Present Illness:   Sean Morrison is an 84 year old right-handed male with complete heart block s/p MRI compatible PPM, HTN and hyperlipidemia who follows up for Alzheimer's disease.  He is accompanied by his wife who supplements history.  UPDATE: Current medication:  Namenda XR 28mg  daily  He has been more confused.  He naps often in front of the TV during the day.  Sometimes in the middle of the night he wakes up and thinks it is the morning and will walk into his wife's room to get her up.  He is not combative.  He is able to bath himself but his wife stands by in case he falls.  He needs some assistance dressing.  He uses the toilet independently.  His wife handles the driving, shopping and finances.    HISTORY: In the fall of 2016, he had a severe respiratory infection.  During that time, he was delirious.  He would walk around the house at night and would hallucinate. He had a PPM in February 2017 for complete heart block.  While he was in the hospital prior to surgery, he had an episode of agitation.  Since  then, he has had a gradual decline.  He has been hallucinating, such as children playing or people not there.  He has short-term memory problems.  He forgets appointments and conversations.  He frequently repeats questions.  Long-term memory is better but sometimes when he talks about the past, it may not be true.  One time, he didn't seem to recognize his wife for a moment.  He needs assistance using the shower.  He has trouble operating the TV remote.  He no longer can use the telephone.  Sometimes he has trouble eating certain foods, such as how to eat a hamburger.  He doesn't always remember how to use to toilet or how to get to the bathroom in the house.  No aggression or combativeness.  He sometimes may get agitated because he forgets what he wanted to say.  He does not seem more depressed but he often says that he knows something is wrong but cannot do anything about it.  He has had poor sleep for many years.  He sleeps in a recliner because of chronic pain in the chest after surgery for a cancer.  He does not drive.  MRI of brain with and without contrast from 05/26/18 was personally reviewed and showed moderate cerebral atrophy and minimal chronic small vessel ischemic changes.  He had adverse reaction to Aricept.  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  He knew the thousands of parts that they sold by memory.    His wife reports that they have living wills but this was many years ago. No known family history of dementia.   Observations/Objective:   There were no vitals taken for this visit. No acute distress.  Speech fluent and not dysarthric.     Assessment and Plan:  Alzheimer's disease with significant memory deficits, hallucinations, and apraxia, requiring assistance for most ADLs.  1.  Namenda XR 28mg  daily 2.  24 hour supervision 3.  Recommend taking melatonin 1 to 3 mg at bedtime to help improve sleep cycle. 4.  Follow up  in 9 months.   Follow Up Instructions:    -I discussed the assessment and treatment plan with the patient. The patient was provided an opportunity to ask questions and all were answered. The patient agreed with the plan and demonstrated an understanding of the instructions.   The patient was advised to call back or seek an in-person evaluation if the symptoms worsen or if the condition fails to improve as anticipated.    Total Time spent in visit with the patient was:  18 minutes  Dudley Major, DO

## 2019-03-14 ENCOUNTER — Other Ambulatory Visit: Payer: Self-pay

## 2019-03-14 ENCOUNTER — Telehealth (INDEPENDENT_AMBULATORY_CARE_PROVIDER_SITE_OTHER): Payer: PPO | Admitting: Neurology

## 2019-03-14 ENCOUNTER — Encounter: Payer: Self-pay | Admitting: Neurology

## 2019-03-14 ENCOUNTER — Ambulatory Visit: Payer: PPO | Admitting: Neurology

## 2019-03-14 DIAGNOSIS — F0280 Dementia in other diseases classified elsewhere without behavioral disturbance: Secondary | ICD-10-CM | POA: Diagnosis not present

## 2019-03-14 DIAGNOSIS — G301 Alzheimer's disease with late onset: Secondary | ICD-10-CM

## 2019-05-10 DIAGNOSIS — L97929 Non-pressure chronic ulcer of unspecified part of left lower leg with unspecified severity: Secondary | ICD-10-CM | POA: Diagnosis not present

## 2019-05-11 ENCOUNTER — Ambulatory Visit (INDEPENDENT_AMBULATORY_CARE_PROVIDER_SITE_OTHER): Payer: PPO | Admitting: *Deleted

## 2019-05-11 ENCOUNTER — Other Ambulatory Visit: Payer: Self-pay | Admitting: Family Medicine

## 2019-05-11 DIAGNOSIS — I442 Atrioventricular block, complete: Secondary | ICD-10-CM

## 2019-05-11 DIAGNOSIS — L97929 Non-pressure chronic ulcer of unspecified part of left lower leg with unspecified severity: Secondary | ICD-10-CM

## 2019-05-11 LAB — CUP PACEART REMOTE DEVICE CHECK
Battery Remaining Longevity: 64 mo
Battery Voltage: 3 V
Brady Statistic AP VP Percent: 94.65 %
Brady Statistic AP VS Percent: 0 %
Brady Statistic AS VP Percent: 5.35 %
Brady Statistic AS VS Percent: 0 %
Brady Statistic RA Percent Paced: 94.53 %
Brady Statistic RV Percent Paced: 99.99 %
Date Time Interrogation Session: 20200819151534
Implantable Lead Implant Date: 20170203
Implantable Lead Implant Date: 20170203
Implantable Lead Location: 753859
Implantable Lead Location: 753860
Implantable Lead Model: 5076
Implantable Lead Model: 5076
Implantable Pulse Generator Implant Date: 20170203
Lead Channel Impedance Value: 342 Ohm
Lead Channel Impedance Value: 456 Ohm
Lead Channel Impedance Value: 475 Ohm
Lead Channel Impedance Value: 532 Ohm
Lead Channel Pacing Threshold Amplitude: 0.625 V
Lead Channel Pacing Threshold Amplitude: 0.625 V
Lead Channel Pacing Threshold Pulse Width: 0.4 ms
Lead Channel Pacing Threshold Pulse Width: 0.4 ms
Lead Channel Sensing Intrinsic Amplitude: 1.875 mV
Lead Channel Sensing Intrinsic Amplitude: 1.875 mV
Lead Channel Sensing Intrinsic Amplitude: 9.375 mV
Lead Channel Sensing Intrinsic Amplitude: 9.375 mV
Lead Channel Setting Pacing Amplitude: 2 V
Lead Channel Setting Pacing Amplitude: 2.5 V
Lead Channel Setting Pacing Pulse Width: 0.4 ms
Lead Channel Setting Sensing Sensitivity: 0.9 mV

## 2019-05-18 ENCOUNTER — Ambulatory Visit
Admission: RE | Admit: 2019-05-18 | Discharge: 2019-05-18 | Disposition: A | Discharge status: Home / Self Care | Payer: PPO | Source: Ambulatory Visit | Attending: Family Medicine | Admitting: Family Medicine

## 2019-05-18 DIAGNOSIS — L97929 Non-pressure chronic ulcer of unspecified part of left lower leg with unspecified severity: Secondary | ICD-10-CM

## 2019-05-19 ENCOUNTER — Encounter: Payer: Self-pay | Admitting: Cardiology

## 2019-05-19 NOTE — Progress Notes (Signed)
Remote pacemaker transmission.   

## 2019-05-23 ENCOUNTER — Other Ambulatory Visit: Payer: Self-pay

## 2019-05-23 ENCOUNTER — Encounter (HOSPITAL_BASED_OUTPATIENT_CLINIC_OR_DEPARTMENT_OTHER): Payer: PPO | Attending: Internal Medicine

## 2019-05-23 DIAGNOSIS — F0280 Dementia in other diseases classified elsewhere without behavioral disturbance: Secondary | ICD-10-CM | POA: Insufficient documentation

## 2019-05-23 DIAGNOSIS — I87332 Chronic venous hypertension (idiopathic) with ulcer and inflammation of left lower extremity: Secondary | ICD-10-CM | POA: Diagnosis not present

## 2019-05-23 DIAGNOSIS — L97222 Non-pressure chronic ulcer of left calf with fat layer exposed: Secondary | ICD-10-CM | POA: Diagnosis not present

## 2019-05-23 DIAGNOSIS — G309 Alzheimer's disease, unspecified: Secondary | ICD-10-CM | POA: Diagnosis not present

## 2019-05-23 DIAGNOSIS — L97821 Non-pressure chronic ulcer of other part of left lower leg limited to breakdown of skin: Secondary | ICD-10-CM | POA: Insufficient documentation

## 2019-05-23 DIAGNOSIS — I890 Lymphedema, not elsewhere classified: Secondary | ICD-10-CM | POA: Diagnosis not present

## 2019-05-23 DIAGNOSIS — I10 Essential (primary) hypertension: Secondary | ICD-10-CM | POA: Insufficient documentation

## 2019-05-23 DIAGNOSIS — Z87891 Personal history of nicotine dependence: Secondary | ICD-10-CM | POA: Insufficient documentation

## 2019-05-23 DIAGNOSIS — Z950 Presence of cardiac pacemaker: Secondary | ICD-10-CM | POA: Diagnosis not present

## 2019-05-23 DIAGNOSIS — I87312 Chronic venous hypertension (idiopathic) with ulcer of left lower extremity: Secondary | ICD-10-CM | POA: Diagnosis not present

## 2019-05-27 DIAGNOSIS — S81802A Unspecified open wound, left lower leg, initial encounter: Secondary | ICD-10-CM | POA: Diagnosis not present

## 2019-05-31 ENCOUNTER — Encounter (HOSPITAL_BASED_OUTPATIENT_CLINIC_OR_DEPARTMENT_OTHER): Payer: PPO | Attending: Internal Medicine

## 2019-05-31 DIAGNOSIS — I10 Essential (primary) hypertension: Secondary | ICD-10-CM | POA: Diagnosis not present

## 2019-05-31 DIAGNOSIS — L97812 Non-pressure chronic ulcer of other part of right lower leg with fat layer exposed: Secondary | ICD-10-CM | POA: Diagnosis not present

## 2019-05-31 DIAGNOSIS — L97822 Non-pressure chronic ulcer of other part of left lower leg with fat layer exposed: Secondary | ICD-10-CM | POA: Insufficient documentation

## 2019-05-31 DIAGNOSIS — I87333 Chronic venous hypertension (idiopathic) with ulcer and inflammation of bilateral lower extremity: Secondary | ICD-10-CM | POA: Insufficient documentation

## 2019-06-03 DIAGNOSIS — S81802A Unspecified open wound, left lower leg, initial encounter: Secondary | ICD-10-CM | POA: Diagnosis not present

## 2019-06-04 DIAGNOSIS — Z23 Encounter for immunization: Secondary | ICD-10-CM | POA: Diagnosis not present

## 2019-06-06 DIAGNOSIS — S81802A Unspecified open wound, left lower leg, initial encounter: Secondary | ICD-10-CM | POA: Diagnosis not present

## 2019-06-08 DIAGNOSIS — L97812 Non-pressure chronic ulcer of other part of right lower leg with fat layer exposed: Secondary | ICD-10-CM | POA: Diagnosis not present

## 2019-06-08 DIAGNOSIS — I87333 Chronic venous hypertension (idiopathic) with ulcer and inflammation of bilateral lower extremity: Secondary | ICD-10-CM | POA: Diagnosis not present

## 2019-06-08 DIAGNOSIS — I87313 Chronic venous hypertension (idiopathic) with ulcer of bilateral lower extremity: Secondary | ICD-10-CM | POA: Diagnosis not present

## 2019-06-08 DIAGNOSIS — L97822 Non-pressure chronic ulcer of other part of left lower leg with fat layer exposed: Secondary | ICD-10-CM | POA: Diagnosis not present

## 2019-06-09 DIAGNOSIS — I87312 Chronic venous hypertension (idiopathic) with ulcer of left lower extremity: Secondary | ICD-10-CM | POA: Diagnosis not present

## 2019-06-10 DIAGNOSIS — S81802A Unspecified open wound, left lower leg, initial encounter: Secondary | ICD-10-CM | POA: Diagnosis not present

## 2019-06-12 ENCOUNTER — Other Ambulatory Visit: Payer: Self-pay | Admitting: Neurology

## 2019-06-15 DIAGNOSIS — I87311 Chronic venous hypertension (idiopathic) with ulcer of right lower extremity: Secondary | ICD-10-CM | POA: Diagnosis not present

## 2019-06-15 DIAGNOSIS — S80822A Blister (nonthermal), left lower leg, initial encounter: Secondary | ICD-10-CM | POA: Diagnosis not present

## 2019-06-15 DIAGNOSIS — I87333 Chronic venous hypertension (idiopathic) with ulcer and inflammation of bilateral lower extremity: Secondary | ICD-10-CM | POA: Diagnosis not present

## 2019-06-15 DIAGNOSIS — L97812 Non-pressure chronic ulcer of other part of right lower leg with fat layer exposed: Secondary | ICD-10-CM | POA: Diagnosis not present

## 2019-06-17 DIAGNOSIS — S81802A Unspecified open wound, left lower leg, initial encounter: Secondary | ICD-10-CM | POA: Diagnosis not present

## 2019-06-22 DIAGNOSIS — I87333 Chronic venous hypertension (idiopathic) with ulcer and inflammation of bilateral lower extremity: Secondary | ICD-10-CM | POA: Diagnosis not present

## 2019-06-22 DIAGNOSIS — L97812 Non-pressure chronic ulcer of other part of right lower leg with fat layer exposed: Secondary | ICD-10-CM | POA: Diagnosis not present

## 2019-06-22 DIAGNOSIS — S81801A Unspecified open wound, right lower leg, initial encounter: Secondary | ICD-10-CM | POA: Diagnosis not present

## 2019-06-23 DIAGNOSIS — S81802A Unspecified open wound, left lower leg, initial encounter: Secondary | ICD-10-CM | POA: Diagnosis not present

## 2019-06-24 DIAGNOSIS — S81802A Unspecified open wound, left lower leg, initial encounter: Secondary | ICD-10-CM | POA: Diagnosis not present

## 2019-08-10 ENCOUNTER — Ambulatory Visit (INDEPENDENT_AMBULATORY_CARE_PROVIDER_SITE_OTHER): Payer: PPO | Admitting: *Deleted

## 2019-08-10 DIAGNOSIS — I442 Atrioventricular block, complete: Secondary | ICD-10-CM

## 2019-08-10 LAB — CUP PACEART REMOTE DEVICE CHECK
Battery Remaining Longevity: 63 mo
Battery Voltage: 3 V
Brady Statistic AP VP Percent: 92.29 %
Brady Statistic AP VS Percent: 0 %
Brady Statistic AS VP Percent: 7.71 %
Brady Statistic AS VS Percent: 0 %
Brady Statistic RA Percent Paced: 92.07 %
Brady Statistic RV Percent Paced: 99.99 %
Date Time Interrogation Session: 20201118093836
Implantable Lead Implant Date: 20170203
Implantable Lead Implant Date: 20170203
Implantable Lead Location: 753859
Implantable Lead Location: 753860
Implantable Lead Model: 5076
Implantable Lead Model: 5076
Implantable Pulse Generator Implant Date: 20170203
Lead Channel Impedance Value: 342 Ohm
Lead Channel Impedance Value: 456 Ohm
Lead Channel Impedance Value: 475 Ohm
Lead Channel Impedance Value: 532 Ohm
Lead Channel Pacing Threshold Amplitude: 0.5 V
Lead Channel Pacing Threshold Amplitude: 0.75 V
Lead Channel Pacing Threshold Pulse Width: 0.4 ms
Lead Channel Pacing Threshold Pulse Width: 0.4 ms
Lead Channel Sensing Intrinsic Amplitude: 2.125 mV
Lead Channel Sensing Intrinsic Amplitude: 2.125 mV
Lead Channel Sensing Intrinsic Amplitude: 9.375 mV
Lead Channel Sensing Intrinsic Amplitude: 9.375 mV
Lead Channel Setting Pacing Amplitude: 2 V
Lead Channel Setting Pacing Amplitude: 2.5 V
Lead Channel Setting Pacing Pulse Width: 0.4 ms
Lead Channel Setting Sensing Sensitivity: 0.9 mV

## 2019-08-31 DIAGNOSIS — L97929 Non-pressure chronic ulcer of unspecified part of left lower leg with unspecified severity: Secondary | ICD-10-CM | POA: Diagnosis not present

## 2019-08-31 DIAGNOSIS — E782 Mixed hyperlipidemia: Secondary | ICD-10-CM | POA: Diagnosis not present

## 2019-08-31 DIAGNOSIS — R413 Other amnesia: Secondary | ICD-10-CM | POA: Diagnosis not present

## 2019-09-07 NOTE — Progress Notes (Signed)
Remote pacemaker transmission.   

## 2019-09-08 DIAGNOSIS — H25813 Combined forms of age-related cataract, bilateral: Secondary | ICD-10-CM | POA: Diagnosis not present

## 2019-10-10 ENCOUNTER — Other Ambulatory Visit: Payer: Self-pay | Admitting: Neurology

## 2019-11-09 ENCOUNTER — Ambulatory Visit (INDEPENDENT_AMBULATORY_CARE_PROVIDER_SITE_OTHER): Payer: PPO | Admitting: *Deleted

## 2019-11-09 DIAGNOSIS — I442 Atrioventricular block, complete: Secondary | ICD-10-CM

## 2019-11-09 LAB — CUP PACEART REMOTE DEVICE CHECK
Battery Remaining Longevity: 62 mo
Battery Voltage: 2.99 V
Brady Statistic AP VP Percent: 90.71 %
Brady Statistic AP VS Percent: 0 %
Brady Statistic AS VP Percent: 9.29 %
Brady Statistic AS VS Percent: 0 %
Brady Statistic RA Percent Paced: 90.52 %
Brady Statistic RV Percent Paced: 99.99 %
Date Time Interrogation Session: 20210217150213
Implantable Lead Implant Date: 20170203
Implantable Lead Implant Date: 20170203
Implantable Lead Location: 753859
Implantable Lead Location: 753860
Implantable Lead Model: 5076
Implantable Lead Model: 5076
Implantable Pulse Generator Implant Date: 20170203
Lead Channel Impedance Value: 304 Ohm
Lead Channel Impedance Value: 399 Ohm
Lead Channel Impedance Value: 437 Ohm
Lead Channel Impedance Value: 494 Ohm
Lead Channel Pacing Threshold Amplitude: 0.5 V
Lead Channel Pacing Threshold Amplitude: 0.75 V
Lead Channel Pacing Threshold Pulse Width: 0.4 ms
Lead Channel Pacing Threshold Pulse Width: 0.4 ms
Lead Channel Sensing Intrinsic Amplitude: 1.375 mV
Lead Channel Sensing Intrinsic Amplitude: 1.375 mV
Lead Channel Sensing Intrinsic Amplitude: 9.375 mV
Lead Channel Sensing Intrinsic Amplitude: 9.375 mV
Lead Channel Setting Pacing Amplitude: 2 V
Lead Channel Setting Pacing Amplitude: 2.5 V
Lead Channel Setting Pacing Pulse Width: 0.4 ms
Lead Channel Setting Sensing Sensitivity: 0.9 mV

## 2019-11-09 NOTE — Progress Notes (Signed)
PPM Remote  

## 2019-12-08 NOTE — Progress Notes (Deleted)
NEUROLOGY FOLLOW UP OFFICE NOTE  MALLORY JUSTEN [id number]  HISTORY OF PRESENT ILLNESS: Sean Morrison is an 25 year oldright-handed male with complete heart block s/p MRI compatible PPM, HTN and hyperlipidemia who follows up for Alzheimer's disease.  He is accompanied by his wife who supplements history.  UPDATE: Current medication:  Namenda XR 28mg  daily  He has been more confused.  He naps often in front of the TV during the day.  Sometimes in the middle of the night he wakes up and thinks it is the morning and will walk into his wife's room to get her up.  He is not combative.  He is able to bath himself but his wife stands by in case he falls.  He needs some assistance dressing.  He uses the toilet independently.  His wife handles the driving, shopping and finances.    HISTORY: In the fall of 2016, he had a severe respiratory infection. During that time, he was delirious. He would walk around the house at night and would hallucinate. He had a PPM in February 2017 for complete heart block. While he was in the hospital prior to surgery, he had an episode of agitation. Since then, he has had a gradual decline. He has been hallucinating, such as children playing or people not there. He has short-term memory problems. He forgets appointments and conversations. He frequently repeats questions. Long-term memory is better but sometimes when he talks about the past, it may not be true. One time, he didn't seem to recognize his wife for a moment. He needs assistance using the shower. He has trouble operating the TV remote. He no longer can use the telephone. Sometimes he has trouble eating certain foods, such as how to eat a hamburger. He doesn't always remember how to use to toilet or how to get to the bathroom in the house. No aggression or combativeness. He sometimes may get agitated because he forgets what he wanted to say. He does not seem more depressed but he often says that  he knows something is wrong but cannot do anything about it. He has had poor sleep for many years. He sleeps in a recliner because of chronic pain in the chest after surgery for a cancer. He does not drive.  MRI of brain with and without contrast from 05/26/18 was personally reviewed andshowed moderate cerebral atrophy and minimal chronic small vessel ischemic changes.  He had adverse reaction to Aricept.  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   His wife reports that they have living wills but this was many years ago. No known family history of dementia.  PAST MEDICAL HISTORY: Past Medical History:  Diagnosis Date  . Complete heart block ([place]) 09/2015   a. s/p MDT MRI compatible dual chamber PPM   . Diverticulitis   . Elevated cholesterol   . Essential hypertension 10/27/2015  . 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 carvedilol (COREG) 6.25 MG tablet Take 6.25 mg by mouth daily.    . cephALEXin (KEFLEX) 500 MG capsule Take 1 capsule (500 mg total) by mouth every 8 (eight) hours. 30 capsule 0  . fenofibrate micronized (LOFIBRA) 134 MG capsule Take 134 mg by mouth every evening.     . furosemide (LASIX)  20 MG tablet Take 1 tablet (20 mg total) by mouth every other day. 45 tablet 3  . memantine (NAMENDA XR) 28 MG CP24 24 hr capsule TAKE ONE CAPSULE BY MOUTH DAILY 30 capsule 2  . simvastatin (ZOCOR) 20 MG tablet Take 20 mg by mouth daily.     No current facility-administered medications on file prior to visit.    ALLERGIES: No Known Allergies  FAMILY HISTORY: Family History  Problem Relation Age of Onset  . Lung cancer Mother   . Heart failure Father    ***.  SOCIAL HISTORY: Social History   Socioeconomic History  . Marital status: Married    Spouse name:  Collie Siad  . Number of children: Not on file  . Years of education: Not on file  . Highest education level: 12th grade  Occupational History  . Occupation: retired  Tobacco Use  . Smoking status: Former Smoker    Types: Cigarettes    Quit date: 10/04/1968    Years since quitting: 51.2  . Smokeless tobacco: Never Used  Substance and Sexual Activity  . Alcohol use: No  . Drug use: No  . Sexual activity: Not Currently  Other Topics Concern  . Not on file  Social History Narrative   Patient is right-handed.   Social Determinants of Health   Financial Resource Strain:   . Difficulty of Paying Living Expenses:   Food Insecurity:   . Worried About Charity fundraiser in the Last Year:   . Arboriculturist in the Last Year:   Transportation Needs:   . Film/video editor (Medical):   Marland Kitchen Lack of Transportation (Non-Medical):   Physical Activity:   . Days of Exercise per Week:   . Minutes of Exercise per Session:   Stress:   . Feeling of Stress :   Social Connections:   . Frequency of Communication with Friends and Family:   . Frequency of Social Gatherings with Friends and Family:   . Attends Religious Services:   . Active Member of Clubs or Organizations:   . Attends Archivist Meetings:   Marland Kitchen Marital Status:   Intimate Partner Violence:   . Fear of Current or Ex-Partner:   . Emotionally Abused:   Marland Kitchen Physically Abused:   . Sexually Abused:     REVIEW OF SYSTEMS: Constitutional: No fevers, chills, or sweats, no generalized fatigue, change in appetite Eyes: No visual changes, double vision, eye pain Ear, nose and throat: No hearing loss, ear pain, nasal congestion, sore throat Cardiovascular: No chest pain, palpitations Respiratory:  No shortness of breath at rest or with exertion, wheezes GastrointestinaI: No nausea, vomiting, diarrhea, abdominal pain, fecal incontinence Genitourinary:  No dysuria, urinary retention or frequency Musculoskeletal:  No neck pain, back  pain Integumentary: No rash, pruritus, skin lesions Neurological: as above Psychiatric: No depression, insomnia, anxiety Endocrine: No palpitations, fatigue, diaphoresis, mood swings, change in appetite, change in weight, increased thirst Hematologic/Lymphatic:  No purpura, petechiae. Allergic/Immunologic: no itchy/runny eyes, nasal congestion, recent allergic reactions, rashes  PHYSICAL EXAM: *** General: No acute distress.  Patient appears ***-groomed.   Head:  Normocephalic/atraumatic Eyes:  Fundi examined but not visualized Neck: supple, no paraspinal tenderness, full range of motion Heart:  Regular rate and rhythm Lungs:  Clear to auscultation bilaterally Back: No paraspinal tenderness Neurological Exam: alert and oriented to person, place, and time. Attention span and concentration intact, recent and remote memory intact, fund of knowledge intact.  Speech fluent and not dysarthric,  language intact.  CN II-XII intact. Bulk and tone normal, muscle strength 5/5 throughout.  Sensation to light touch, temperature and vibration intact.  Deep tendon reflexes 2+ throughout, toes downgoing.  Finger to nose and heel to shin testing intact.  Gait normal, Romberg negative.  IMPRESSION: ***  PLAN: ***  Metta Clines, DO  CC: ***

## 2019-12-12 ENCOUNTER — Ambulatory Visit: Payer: PPO | Admitting: Neurology

## 2019-12-20 ENCOUNTER — Encounter: Payer: PPO | Admitting: Internal Medicine

## 2020-01-08 ENCOUNTER — Other Ambulatory Visit: Payer: Self-pay | Admitting: Neurology

## 2020-02-08 ENCOUNTER — Ambulatory Visit (INDEPENDENT_AMBULATORY_CARE_PROVIDER_SITE_OTHER): Payer: PPO | Admitting: *Deleted

## 2020-02-08 DIAGNOSIS — I442 Atrioventricular block, complete: Secondary | ICD-10-CM | POA: Diagnosis not present

## 2020-02-08 LAB — CUP PACEART REMOTE DEVICE CHECK
Battery Remaining Longevity: 54 mo
Battery Voltage: 2.99 V
Brady Statistic AP VP Percent: 92.68 %
Brady Statistic AP VS Percent: 0 %
Brady Statistic AS VP Percent: 7.32 %
Brady Statistic AS VS Percent: 0 %
Brady Statistic RA Percent Paced: 92.48 %
Brady Statistic RV Percent Paced: 99.93 %
Date Time Interrogation Session: 20210519132317
Implantable Lead Implant Date: 20170203
Implantable Lead Implant Date: 20170203
Implantable Lead Location: 753859
Implantable Lead Location: 753860
Implantable Lead Model: 5076
Implantable Lead Model: 5076
Implantable Pulse Generator Implant Date: 20170203
Lead Channel Impedance Value: 323 Ohm
Lead Channel Impedance Value: 399 Ohm
Lead Channel Impedance Value: 418 Ohm
Lead Channel Impedance Value: 475 Ohm
Lead Channel Pacing Threshold Amplitude: 0.5 V
Lead Channel Pacing Threshold Amplitude: 0.875 V
Lead Channel Pacing Threshold Pulse Width: 0.4 ms
Lead Channel Pacing Threshold Pulse Width: 0.4 ms
Lead Channel Sensing Intrinsic Amplitude: 1.625 mV
Lead Channel Sensing Intrinsic Amplitude: 1.625 mV
Lead Channel Sensing Intrinsic Amplitude: 9.375 mV
Lead Channel Sensing Intrinsic Amplitude: 9.375 mV
Lead Channel Setting Pacing Amplitude: 2 V
Lead Channel Setting Pacing Amplitude: 2.5 V
Lead Channel Setting Pacing Pulse Width: 0.4 ms
Lead Channel Setting Sensing Sensitivity: 0.9 mV

## 2020-02-09 NOTE — Progress Notes (Signed)
Remote pacemaker transmission.   

## 2020-02-13 NOTE — Progress Notes (Signed)
NEUROLOGY FOLLOW UP OFFICE NOTE  Sean Morrison [id number]  HISTORY OF PRESENT ILLNESS: Sean Morrison is an 63 year oldright-handed male with complete heart block s/p MRI compatible PPM, HTN and hyperlipidemia who follows up for Alzheimer's disease.  He is accompanied by his wife who supplements history.  UPDATE: Current medication:  Namenda XR 28mg  daily; melatonin 12mg  at bedtime  His dementia has progressed.  He doesn't recognize his wife often.  He stays asleep with 12mg  melatonin nightly.  After breakfast, he takes a 2 to 3 hour nap.  He naps throughout the day.  He wears diapers now.  He requires assistance with bathing, dressing and using toilet.  He can eat himself but sometimes needs to be told to eat.  Appetite is still good although less than before and he has lost some weight.  Visual hallucinations aren't as bad as before.  Conversation is minimal.  He sometimes speaks jibberish.  He is not angry or combative.  He gets upset if strangers come into the house.  His wife doesn't have any assistance at home. She is able to go out and work in her yard, which is therapeutic for her.  She does leave him for up to a couple of hours to go to the grocery store.  He does not leave the house and they have a locked gate outside the yard.    HISTORY: In the fall of 2016, he had a severe respiratory infection. During that time, he was delirious. He would walk around the house at night and would hallucinate. He had a PPM in February 2017 for complete heart block. While he was in the hospital prior to surgery, he had an episode of agitation. Since then, he has had a gradual decline. He has been hallucinating, such as children playing or people not there. He has short-term memory problems. He forgets appointments and conversations. He frequently repeats questions. Long-term memory is better but sometimes when he talks about the past, it may not be true. One time, he didn't seem to  recognize his wife for a moment. He needs assistance using the shower. He has trouble operating the TV remote. He no longer can use the telephone. Sometimes he has trouble eating certain foods, such as how to eat a hamburger. He doesn't always remember how to use to toilet or how to get to the bathroom in the house. No aggression or combativeness. He sometimes may get agitated because he forgets what he wanted to say. He does not seem more depressed but he often says that he knows something is wrong but cannot do anything about it. He has had poor sleep for many years. He sleeps in a recliner because of chronic pain in the chest after surgery for a cancer. He does not drive.  MRI of brain with and without contrast from 05/26/18 was personally reviewed andshowed moderate cerebral atrophy and minimal chronic small vessel ischemic changes.  He had adverse reaction to Aricept.  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   His wife reports that they have living wills but this was many years ago. No known family history of dementia.  PAST MEDICAL HISTORY: Past Medical History:  Diagnosis Date  . Complete heart block ([place]) 09/2015   a. s/p MDT MRI compatible dual chamber PPM   . Diverticulitis   . Elevated cholesterol   .  Essential hypertension 10/27/2015  . 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 carvedilol (COREG) 6.25 MG tablet Take 6.25 mg by mouth daily.    . cephALEXin (KEFLEX) 500 MG capsule Take 1 capsule (500 mg total) by mouth every 8 (eight) hours. 30 capsule 0  . fenofibrate micronized (LOFIBRA) 134 MG capsule Take 134 mg by mouth every evening.     . furosemide (LASIX) 20 MG tablet Take 1 tablet (20 mg total) by mouth every other day. 45 tablet 3  . memantine (NAMENDA  XR) 28 MG CP24 24 hr capsule TAKE ONE CAPSULE BY MOUTH DAILY 30 capsule 5  . simvastatin (ZOCOR) 20 MG tablet Take 20 mg by mouth daily.     No current facility-administered medications on file prior to visit.    ALLERGIES: No Known Allergies  FAMILY HISTORY: Family History  Problem Relation Age of Onset  . Lung cancer Mother   . Heart failure Father     SOCIAL HISTORY: Social History   Socioeconomic History  . Marital status: Married    Spouse name: Collie Siad  . Number of children: Not on file  . Years of education: Not on file  . Highest education level: 12th grade  Occupational History  . Occupation: retired  Tobacco Use  . Smoking status: Former Smoker    Types: Cigarettes    Quit date: 10/04/1968    Years since quitting: 51.3  . Smokeless tobacco: Never Used  Substance and Sexual Activity  . Alcohol use: No  . Drug use: No  . Sexual activity: Not Currently  Other Topics Concern  . Not on file  Social History Narrative   Patient is right-handed.   Social Determinants of Health   Financial Resource Strain:   . Difficulty of Paying Living Expenses:   Food Insecurity:   . Worried About Charity fundraiser in the Last Year:   . Arboriculturist in the Last Year:   Transportation Needs:   . Film/video editor (Medical):   Marland Kitchen Lack of Transportation (Non-Medical):   Physical Activity:   . Days of Exercise per Week:   . Minutes of Exercise per Session:   Stress:   . Feeling of Stress :   Social Connections:   . Frequency of Communication with Friends and Family:   . Frequency of Social Gatherings with Friends and Family:   . Attends Religious Services:   . Active Member of Clubs or Organizations:   . Attends Archivist Meetings:   Marland Kitchen Marital Status:   Intimate Partner Violence:   . Fear of Current or Ex-Partner:   . Emotionally Abused:   Marland Kitchen Physically Abused:   . Sexually Abused:     PHYSICAL EXAM: Blood pressure 98/78, pulse 77, resp. rate 18,  height 5\' 8"  (1.727 m), weight 149 lb (67.6 kg), SpO2 97 %. General: No acute distress.  Patient appears well-groomed.   Head:  Normocephalic/atraumatic Eyes:  Fundi examined but not visualized Neck: supple, no paraspinal tenderness, full range of motion Heart:  Regular rate and rhythm Lungs:  Clear to auscultation bilaterally Back: No paraspinal tenderness Neurological Exam: alert and oriented to person only. Attention span and concentration poor, recent and remote memory poor, fund of knowledge poor.  Decreased verbal output but speech fluent and not dysarthric.  Difficulty naming, repeating and following some commands.  CN II-XII intact. Bulk and  tone normal, muscle strength 5/5 throughout.  Deep tendon reflexes 2+ throughout.  Gait steady.  IMPRESSION: Major neurocognitive disorder, Alzheimer's  PLAN: 1.  Continue Namenda XR 28mg  daily 2.  Discussed with Mrs. Corron about researching services that may help her in caring for her husband (maybe an aid can come by several days a week for a couple of hours).  I also said that while it seems manageable now, she should research possible services if/when she acknowledges that he may need higher level of care.  Provided her resources. 3.  Follow up in 9 months  Metta Clines, DO  CC: Donavan Burnet, MD

## 2020-02-14 ENCOUNTER — Other Ambulatory Visit: Payer: Self-pay

## 2020-02-14 ENCOUNTER — Ambulatory Visit (INDEPENDENT_AMBULATORY_CARE_PROVIDER_SITE_OTHER): Payer: PPO | Admitting: Neurology

## 2020-02-14 ENCOUNTER — Encounter: Payer: Self-pay | Admitting: Neurology

## 2020-02-14 VITALS — BP 98/78 | HR 77 | Resp 18 | Ht 68.0 in | Wt 149.0 lb

## 2020-02-14 DIAGNOSIS — F0280 Dementia in other diseases classified elsewhere without behavioral disturbance: Secondary | ICD-10-CM | POA: Diagnosis not present

## 2020-02-14 DIAGNOSIS — G301 Alzheimer's disease with late onset: Secondary | ICD-10-CM

## 2020-02-14 NOTE — Patient Instructions (Signed)
1.  Continue memantine. 2.  Please review the resources to see if there are any services that may be helpful 3.  Follow up in 9 months.

## 2020-02-28 DIAGNOSIS — Z0000 Encounter for general adult medical examination without abnormal findings: Secondary | ICD-10-CM | POA: Diagnosis not present

## 2020-02-28 DIAGNOSIS — I700 Atherosclerosis of aorta: Secondary | ICD-10-CM | POA: Diagnosis not present

## 2020-02-28 DIAGNOSIS — E782 Mixed hyperlipidemia: Secondary | ICD-10-CM | POA: Diagnosis not present

## 2020-02-28 DIAGNOSIS — R413 Other amnesia: Secondary | ICD-10-CM | POA: Diagnosis not present

## 2020-02-28 DIAGNOSIS — L97909 Non-pressure chronic ulcer of unspecified part of unspecified lower leg with unspecified severity: Secondary | ICD-10-CM | POA: Diagnosis not present

## 2020-02-28 DIAGNOSIS — I442 Atrioventricular block, complete: Secondary | ICD-10-CM | POA: Diagnosis not present

## 2020-05-09 ENCOUNTER — Ambulatory Visit (INDEPENDENT_AMBULATORY_CARE_PROVIDER_SITE_OTHER): Payer: PPO | Admitting: *Deleted

## 2020-05-09 DIAGNOSIS — I442 Atrioventricular block, complete: Secondary | ICD-10-CM

## 2020-05-10 LAB — CUP PACEART REMOTE DEVICE CHECK
Battery Remaining Longevity: 48 mo
Battery Voltage: 2.98 V
Brady Statistic AP VP Percent: 91.41 %
Brady Statistic AP VS Percent: 0 %
Brady Statistic AS VP Percent: 8.58 %
Brady Statistic AS VS Percent: 0 %
Brady Statistic RA Percent Paced: 91.19 %
Brady Statistic RV Percent Paced: 99.95 %
Date Time Interrogation Session: 20210818151306
Implantable Lead Implant Date: 20170203
Implantable Lead Implant Date: 20170203
Implantable Lead Location: 753859
Implantable Lead Location: 753860
Implantable Lead Model: 5076
Implantable Lead Model: 5076
Implantable Pulse Generator Implant Date: 20170203
Lead Channel Impedance Value: 323 Ohm
Lead Channel Impedance Value: 399 Ohm
Lead Channel Impedance Value: 437 Ohm
Lead Channel Impedance Value: 475 Ohm
Lead Channel Pacing Threshold Amplitude: 0.5 V
Lead Channel Pacing Threshold Amplitude: 0.875 V
Lead Channel Pacing Threshold Pulse Width: 0.4 ms
Lead Channel Pacing Threshold Pulse Width: 0.4 ms
Lead Channel Sensing Intrinsic Amplitude: 1.875 mV
Lead Channel Sensing Intrinsic Amplitude: 1.875 mV
Lead Channel Sensing Intrinsic Amplitude: 9.375 mV
Lead Channel Sensing Intrinsic Amplitude: 9.375 mV
Lead Channel Setting Pacing Amplitude: 2 V
Lead Channel Setting Pacing Amplitude: 2.5 V
Lead Channel Setting Pacing Pulse Width: 0.4 ms
Lead Channel Setting Sensing Sensitivity: 0.9 mV

## 2020-05-11 NOTE — Progress Notes (Signed)
Remote pacemaker transmission.   

## 2020-07-02 ENCOUNTER — Other Ambulatory Visit: Payer: Self-pay | Admitting: Neurology

## 2020-07-28 DIAGNOSIS — Z23 Encounter for immunization: Secondary | ICD-10-CM | POA: Diagnosis not present

## 2020-07-31 DIAGNOSIS — R3911 Hesitancy of micturition: Secondary | ICD-10-CM | POA: Diagnosis not present

## 2020-07-31 DIAGNOSIS — R531 Weakness: Secondary | ICD-10-CM | POA: Diagnosis not present

## 2020-08-01 DIAGNOSIS — R3911 Hesitancy of micturition: Secondary | ICD-10-CM | POA: Diagnosis not present

## 2020-08-01 DIAGNOSIS — R8290 Unspecified abnormal findings in urine: Secondary | ICD-10-CM | POA: Diagnosis not present

## 2020-08-07 DIAGNOSIS — R3911 Hesitancy of micturition: Secondary | ICD-10-CM | POA: Diagnosis not present

## 2020-08-07 DIAGNOSIS — I442 Atrioventricular block, complete: Secondary | ICD-10-CM | POA: Diagnosis not present

## 2020-08-07 DIAGNOSIS — J449 Chronic obstructive pulmonary disease, unspecified: Secondary | ICD-10-CM | POA: Diagnosis not present

## 2020-08-07 DIAGNOSIS — E7800 Pure hypercholesterolemia, unspecified: Secondary | ICD-10-CM | POA: Diagnosis not present

## 2020-08-07 DIAGNOSIS — I10 Essential (primary) hypertension: Secondary | ICD-10-CM | POA: Diagnosis not present

## 2020-08-07 DIAGNOSIS — Z87891 Personal history of nicotine dependence: Secondary | ICD-10-CM | POA: Diagnosis not present

## 2020-08-07 DIAGNOSIS — I700 Atherosclerosis of aorta: Secondary | ICD-10-CM | POA: Diagnosis not present

## 2020-08-07 DIAGNOSIS — Z9181 History of falling: Secondary | ICD-10-CM | POA: Diagnosis not present

## 2020-08-07 DIAGNOSIS — K5790 Diverticulosis of intestine, part unspecified, without perforation or abscess without bleeding: Secondary | ICD-10-CM | POA: Diagnosis not present

## 2020-08-07 DIAGNOSIS — F411 Generalized anxiety disorder: Secondary | ICD-10-CM | POA: Diagnosis not present

## 2020-08-07 DIAGNOSIS — F0280 Dementia in other diseases classified elsewhere without behavioral disturbance: Secondary | ICD-10-CM | POA: Diagnosis not present

## 2020-08-07 DIAGNOSIS — G309 Alzheimer's disease, unspecified: Secondary | ICD-10-CM | POA: Diagnosis not present

## 2020-08-08 ENCOUNTER — Ambulatory Visit (INDEPENDENT_AMBULATORY_CARE_PROVIDER_SITE_OTHER): Payer: PPO

## 2020-08-08 DIAGNOSIS — I442 Atrioventricular block, complete: Secondary | ICD-10-CM | POA: Diagnosis not present

## 2020-08-09 LAB — CUP PACEART REMOTE DEVICE CHECK
Battery Remaining Longevity: 44 mo
Battery Voltage: 2.98 V
Brady Statistic AP VP Percent: 89.31 %
Brady Statistic AP VS Percent: 0 %
Brady Statistic AS VP Percent: 10.69 %
Brady Statistic AS VS Percent: 0 %
Brady Statistic RA Percent Paced: 88.81 %
Brady Statistic RV Percent Paced: 99.97 %
Date Time Interrogation Session: 20211117142107
Implantable Lead Implant Date: 20170203
Implantable Lead Implant Date: 20170203
Implantable Lead Location: 753859
Implantable Lead Location: 753860
Implantable Lead Model: 5076
Implantable Lead Model: 5076
Implantable Pulse Generator Implant Date: 20170203
Lead Channel Impedance Value: 342 Ohm
Lead Channel Impedance Value: 418 Ohm
Lead Channel Impedance Value: 418 Ohm
Lead Channel Impedance Value: 494 Ohm
Lead Channel Pacing Threshold Amplitude: 0.5 V
Lead Channel Pacing Threshold Amplitude: 0.75 V
Lead Channel Pacing Threshold Pulse Width: 0.4 ms
Lead Channel Pacing Threshold Pulse Width: 0.4 ms
Lead Channel Sensing Intrinsic Amplitude: 2.25 mV
Lead Channel Sensing Intrinsic Amplitude: 2.25 mV
Lead Channel Sensing Intrinsic Amplitude: 31.625 mV
Lead Channel Sensing Intrinsic Amplitude: 31.625 mV
Lead Channel Setting Pacing Amplitude: 2 V
Lead Channel Setting Pacing Amplitude: 2.5 V
Lead Channel Setting Pacing Pulse Width: 0.4 ms
Lead Channel Setting Sensing Sensitivity: 0.9 mV

## 2020-08-09 NOTE — Progress Notes (Signed)
Remote pacemaker transmission.   

## 2020-08-22 DIAGNOSIS — R3911 Hesitancy of micturition: Secondary | ICD-10-CM | POA: Diagnosis not present

## 2020-08-22 DIAGNOSIS — I700 Atherosclerosis of aorta: Secondary | ICD-10-CM | POA: Diagnosis not present

## 2020-08-22 DIAGNOSIS — K5790 Diverticulosis of intestine, part unspecified, without perforation or abscess without bleeding: Secondary | ICD-10-CM | POA: Diagnosis not present

## 2020-08-22 DIAGNOSIS — E7800 Pure hypercholesterolemia, unspecified: Secondary | ICD-10-CM | POA: Diagnosis not present

## 2020-08-22 DIAGNOSIS — Z87891 Personal history of nicotine dependence: Secondary | ICD-10-CM | POA: Diagnosis not present

## 2020-08-22 DIAGNOSIS — I10 Essential (primary) hypertension: Secondary | ICD-10-CM | POA: Diagnosis not present

## 2020-08-22 DIAGNOSIS — J449 Chronic obstructive pulmonary disease, unspecified: Secondary | ICD-10-CM | POA: Diagnosis not present

## 2020-08-22 DIAGNOSIS — I442 Atrioventricular block, complete: Secondary | ICD-10-CM | POA: Diagnosis not present

## 2020-08-22 DIAGNOSIS — F0280 Dementia in other diseases classified elsewhere without behavioral disturbance: Secondary | ICD-10-CM | POA: Diagnosis not present

## 2020-08-22 DIAGNOSIS — G309 Alzheimer's disease, unspecified: Secondary | ICD-10-CM | POA: Diagnosis not present

## 2020-08-22 DIAGNOSIS — Z9181 History of falling: Secondary | ICD-10-CM | POA: Diagnosis not present

## 2020-08-22 DIAGNOSIS — F411 Generalized anxiety disorder: Secondary | ICD-10-CM | POA: Diagnosis not present

## 2020-08-23 ENCOUNTER — Telehealth: Payer: Self-pay | Admitting: Neurology

## 2020-08-23 ENCOUNTER — Other Ambulatory Visit: Payer: Self-pay | Admitting: Neurology

## 2020-08-23 MED ORDER — MEMANTINE HCL 10 MG PO TABS: 10.0000 mg | ORAL_TABLET | Freq: Two times a day (BID) | ORAL | 2 refills | Status: AC

## 2020-08-23 NOTE — Telephone Encounter (Signed)
The IR memantine is a tablet.  Please send prescription for 10mg  tablet twice daily.  She may crush it

## 2020-08-23 NOTE — Telephone Encounter (Signed)
Patient's wife left message with accessnurse asking for a message to be sent to Dr Tomi Likens requesting a call back about her husband. She states that she needs to discuss the medication her husband is taking for his Alzheimer's. She is having trouble getting him to take the medication and she is not sure how effective it actually is at this point. She would like to discuss this with the MD by phone.

## 2020-08-23 NOTE — Telephone Encounter (Signed)
Pt want take the medication, He spits it out. She thinks it working. He sleeps a lot, sometimes his legs jerks out of control. Pt wears depends now, he is unable to make it to the bathroom or remember how to [place] the toilet.  Is there something else he could take that she could sprinkle in a drink or something he could put in his food. Or can she crush the capsule

## 2020-08-31 ENCOUNTER — Telehealth: Payer: Self-pay

## 2020-08-31 NOTE — Telephone Encounter (Signed)
(  3:55p)SW returned call patient's granddaughter-Karen. She advised that patient is rapidly declining: has only eaten two bites of applesauce in the past 3 days, increased sleeping, weakness, and alertness. Patient has been unable to get up from his recliner in the past two days. His granddaughter feels that patient is beyond palliative care. The granddaughter advised that they requested a hospice consult and was surprised that it was a palliative care consult.   SW consulted with Dr. Konrad Dolores, primary RN-M. Nadara Mustard and was advised that patient appears to be clinically appropriate for hospice. SW forwarded referral to [facility] referral center for a hospice consult.  (4:39p)SW telephoned Katie to advise her that the referral to hospice was completed and someone form the referral/admissions department will follow-up.

## 2020-09-22 DEATH — deceased

## 2020-11-16 ENCOUNTER — Ambulatory Visit: Payer: PPO | Admitting: Neurology
